# Patient Record
Sex: Female | Born: 1935 | Race: White | Hispanic: No | Marital: Married | Smoking: Never smoker
Health system: Southern US, Community
[De-identification: ages and names within clinical notes are randomized; demographics above are authoritative.]

## PROBLEM LIST (undated history)

## (undated) DIAGNOSIS — E785 Hyperlipidemia, unspecified: Secondary | ICD-10-CM

## (undated) DIAGNOSIS — G4730 Sleep apnea, unspecified: Secondary | ICD-10-CM

## (undated) DIAGNOSIS — I10 Essential (primary) hypertension: Secondary | ICD-10-CM

## (undated) DIAGNOSIS — G629 Polyneuropathy, unspecified: Secondary | ICD-10-CM

## (undated) DIAGNOSIS — E119 Type 2 diabetes mellitus without complications: Secondary | ICD-10-CM

## (undated) DIAGNOSIS — E079 Disorder of thyroid, unspecified: Secondary | ICD-10-CM

## (undated) HISTORY — PX: ABDOMINAL HYSTERECTOMY: SHX81

## (undated) HISTORY — PX: FRACTURE SURGERY: SHX138

## (undated) HISTORY — PX: TONSILLECTOMY: SUR1361

## (undated) HISTORY — PX: BREAST SURGERY: SHX581

---

## 2004-12-21 ENCOUNTER — Ambulatory Visit: Payer: Self-pay | Admitting: Unknown Physician Specialty

## 2009-09-02 ENCOUNTER — Emergency Department: Payer: Self-pay | Admitting: Emergency Medicine

## 2010-10-27 DIAGNOSIS — E039 Hypothyroidism, unspecified: Secondary | ICD-10-CM | POA: Diagnosis present

## 2010-10-27 DIAGNOSIS — G629 Polyneuropathy, unspecified: Secondary | ICD-10-CM | POA: Insufficient documentation

## 2010-10-27 DIAGNOSIS — I10 Essential (primary) hypertension: Secondary | ICD-10-CM | POA: Diagnosis present

## 2014-01-26 DIAGNOSIS — R001 Bradycardia, unspecified: Secondary | ICD-10-CM | POA: Diagnosis present

## 2014-01-27 DIAGNOSIS — Z8673 Personal history of transient ischemic attack (TIA), and cerebral infarction without residual deficits: Secondary | ICD-10-CM

## 2017-04-03 ENCOUNTER — Other Ambulatory Visit: Payer: Self-pay

## 2017-04-03 ENCOUNTER — Ambulatory Visit
Admission: EM | Admit: 2017-04-03 | Discharge: 2017-04-03 | Disposition: A | Discharge status: Home / Self Care | Payer: Medicare Other | Attending: Family Medicine | Admitting: Family Medicine

## 2017-04-03 ENCOUNTER — Encounter: Payer: Self-pay | Admitting: *Deleted

## 2017-04-03 DIAGNOSIS — L728 Other follicular cysts of the skin and subcutaneous tissue: Secondary | ICD-10-CM | POA: Diagnosis not present

## 2017-04-03 DIAGNOSIS — R220 Localized swelling, mass and lump, head: Secondary | ICD-10-CM

## 2017-04-03 DIAGNOSIS — L729 Follicular cyst of the skin and subcutaneous tissue, unspecified: Secondary | ICD-10-CM

## 2017-04-03 HISTORY — DX: Polyneuropathy, unspecified: G62.9

## 2017-04-03 HISTORY — DX: Sleep apnea, unspecified: G47.30

## 2017-04-03 HISTORY — DX: Disorder of thyroid, unspecified: E07.9

## 2017-04-03 HISTORY — DX: Essential (primary) hypertension: I10

## 2017-04-03 HISTORY — DX: Type 2 diabetes mellitus without complications: E11.9

## 2017-04-03 HISTORY — DX: Hyperlipidemia, unspecified: E78.5

## 2017-04-03 NOTE — Discharge Instructions (Signed)
Keep an eye on it.  I don't feel that you have anything to worry about.  If it is trouble some, call ENT.  Take care  Dr. Adriana Simasook

## 2017-04-03 NOTE — ED Provider Notes (Signed)
[facility]    CSN: [id number] Arrival date & time: 04/03/17  1129  History   Chief Complaint Chief Complaint  Patient presents with  . Mouth Injury   HPI  84 year old female presents with a palpable area on the right side of her mouth.  She states this is been going on for the past few months.  She saw her dentist and she was told by the hygienist that she should be evaluated.  It is not troublesome.  It is nonpainful.  She has no history of tobacco use.  It only bothers her to know that it is there.  She cannot feel it with her tongue.  Only with direct palpation.  She has no other associated symptoms.  No other complaints at this time.  Past Medical History:  Diagnosis Date  . Diabetes mellitus without complication (HCC)   . Hyperlipidemia   . Hypertension   . Neuropathy   . Sleep apnea   . Thyroid disease    Past Surgical History:  Procedure Laterality Date  . ABDOMINAL HYSTERECTOMY    . BREAST SURGERY    . FRACTURE SURGERY    . TONSILLECTOMY     OB History    No data available     Home Medications    Prior to Admission medications   Medication Sig Start Date End Date Taking? Authorizing Provider  amLODipine (NORVASC) 5 MG tablet Take 5 mg by mouth daily.   Yes [provider]  ASCORBIC ACID PO Take by mouth.   Yes [provider]  aspirin EC 81 MG tablet Take 81 mg by mouth daily.   Yes [provider]  Cinnamon 500 MG TABS Take by mouth.   Yes [provider]  gabapentin (NEURONTIN) 300 MG capsule Take 300 mg by mouth 3 (three) times daily.   Yes [provider]  GLUCOSAMINE-CHONDROITIN DS PO Take by mouth.   Yes [provider]  levothyroxine (SYNTHROID, LEVOTHROID) 75 MCG tablet Take 75 mcg by mouth daily before breakfast.   Yes [provider]  ramipril (ALTACE) 10 MG capsule Take 10 mg by mouth daily.   Yes [provider]  rosuvastatin (CRESTOR) 5 MG tablet Take 5 mg by  mouth daily at 6 PM.   Yes [provider]  traZODone (DESYREL) 50 MG tablet Take 50 mg by mouth at bedtime.   Yes [provider]  nitroGLYCERIN (NITROSTAT) 0.6 MG SL tablet Place 0.6 mg under the tongue every 5 (five) minutes as needed for chest pain.    [provider]    Family History Family History  Problem Relation Age of Onset  . Stroke Mother   . Cancer Father   . Cancer Brother     Social History Social History   Tobacco Use  . Smoking status: Never Smoker  . Smokeless tobacco: Never Used  Substance Use Topics  . Alcohol use: No    Frequency: Never  . Drug use: No     Allergies   Rosuvastatin   Review of Systems Review of Systems  Constitutional: Negative.   HENT: Negative.   Skin:       Palpable area - R cheek (perioral).   Physical Exam Triage Vital Signs ED Triage Vitals  Enc Vitals Group     BP 04/03/17 1158 (!) 153/67     Pulse Rate 04/03/17 1158 (!) 51     Resp 04/03/17 1158 16     Temp 04/03/17 1158 98.2 F (  36.8 C)     Temp Source 04/03/17 1158 Oral     SpO2 04/03/17 1158 97 %     Weight 04/03/17 1201 180 lb (81.6 kg)     Height 04/03/17 1201 5\' 3"  (1.6 m)     Head Circumference --      Peak Flow --      Pain Score 04/03/17 1158 0     Pain Loc --      Pain Edu? --      Excl. in GC? --    Updated Vital Signs BP (!) 153/67 (BP Location: Right Arm)   Pulse (!) 51   Temp 98.2 F (36.8 C) (Oral)   Resp 16   Ht 5\' 3"  (1.6 m)   Wt 180 lb (81.6 kg)   SpO2 97%   BMI 31.89 kg/m    Physical Exam  Constitutional: She is oriented to person, place, and time. She appears well-developed and well-nourished. No distress.  HENT:  Head: Normocephalic and atraumatic.  Mouth/Throat: Oropharynx is clear and moist.  Pea-sized firm nodule noted in the right perioral region.  Nontender.  No abnormalities noted in the mouth.  Cardiovascular: Normal rate and regular rhythm.  Pulmonary/Chest: Effort normal and breath sounds  normal. She has no wheezes. She has no rales.  Neurological: She is alert and oriented to person, place, and time.  Psychiatric: She has a normal mood and affect. Her behavior is normal.  Nursing note and vitals reviewed.  UC Treatments / Results  Labs (all labs ordered are listed, but only abnormal results are displayed) Labs Reviewed - No data to display  EKG  EKG Interpretation None       Radiology No results found.  Procedures Procedures (including critical care time)  Medications Ordered in UC Medications - No data to display   Initial Impression / Assessment and Plan / UC Course  I have reviewed the triage vital signs and the nursing notes.  Pertinent labs & imaging results that were available during my care of the patient were reviewed by me and considered in my medical decision making (see chart for details).     84 year old female presents with a palpable area of her right cheek.  Likely benign cyst.  She has never been a tobacco user.  No concerns for underlying malignancy.  I advised watchful waiting.  Reassurance provided.  Gave her recommendations to see ENT if worsens.  Final Clinical Impressions(s) / UC Diagnoses   Final diagnoses:  Benign skin cyst    ED Discharge Orders    None     Controlled Substance Prescriptions [place] Controlled Substance Registry consulted? Not Applicable   Tommie SamsCook, Felicita Nuncio G, DO 04/03/17 1241

## 2017-04-03 NOTE — ED Triage Notes (Signed)
Patient started having symptoms of a possible cyst located right side of mouth 3 months ago. No previous history of mouth cyst.

## 2017-10-08 DIAGNOSIS — H903 Sensorineural hearing loss, bilateral: Secondary | ICD-10-CM | POA: Insufficient documentation

## 2019-05-23 DIAGNOSIS — I5032 Chronic diastolic (congestive) heart failure: Secondary | ICD-10-CM | POA: Diagnosis present

## 2020-04-20 ENCOUNTER — Other Ambulatory Visit: Payer: Self-pay

## 2020-04-20 ENCOUNTER — Emergency Department: Payer: Medicare Other

## 2020-04-20 ENCOUNTER — Emergency Department
Admission: EM | Admit: 2020-04-20 | Discharge: 2020-04-20 | Disposition: A | Discharge status: Home / Self Care | Payer: Medicare Other | Attending: Emergency Medicine | Admitting: Emergency Medicine

## 2020-04-20 DIAGNOSIS — M79604 Pain in right leg: Secondary | ICD-10-CM | POA: Diagnosis not present

## 2020-04-20 DIAGNOSIS — Z7982 Long term (current) use of aspirin: Secondary | ICD-10-CM | POA: Diagnosis not present

## 2020-04-20 DIAGNOSIS — E1140 Type 2 diabetes mellitus with diabetic neuropathy, unspecified: Secondary | ICD-10-CM | POA: Insufficient documentation

## 2020-04-20 DIAGNOSIS — I10 Essential (primary) hypertension: Secondary | ICD-10-CM | POA: Diagnosis not present

## 2020-04-20 DIAGNOSIS — M79661 Pain in right lower leg: Secondary | ICD-10-CM | POA: Diagnosis present

## 2020-04-20 DIAGNOSIS — Z79899 Other long term (current) drug therapy: Secondary | ICD-10-CM | POA: Insufficient documentation

## 2020-04-20 NOTE — ED Provider Notes (Signed)
[facility]  ____________________________________________  Time seen: Approximately 4:20 PM  I have reviewed the triage vital signs and the nursing notes.   HISTORY  Chief Complaint Leg Pain   Historian Patient     HPI  Bonnie Henson is a 87 y.o. female with a history of diabetes, hypertension and neuropathy, presents to the emergency department with acute right calf pain.  Patient states that pain in her right calf started yesterday.  She denies falls or mechanisms of trauma.  Denies history of DVT or PE.  Denies shortness of breath or chest tightness.  No recent travel, prolonged immobilization, recent surgery or daily smoking.   Past Medical History:  Diagnosis Date  . Diabetes mellitus without complication (HCC)   . Hyperlipidemia   . Hypertension   . Neuropathy   . Sleep apnea   . Thyroid disease      Immunizations up to date:  Yes.     Past Medical History:  Diagnosis Date  . Diabetes mellitus without complication (HCC)   . Hyperlipidemia   . Hypertension   . Neuropathy   . Sleep apnea   . Thyroid disease     There are no problems to display for this patient.   Past Surgical History:  Procedure Laterality Date  . ABDOMINAL HYSTERECTOMY    . BREAST SURGERY    . FRACTURE SURGERY    . TONSILLECTOMY      Prior to Admission medications   Medication Sig Start Date End Date Taking? Authorizing Provider  amLODipine (NORVASC) 5 MG tablet Take 5 mg by mouth daily.    [provider]  ASCORBIC ACID PO Take by mouth.    [provider]  aspirin EC 81 MG tablet Take 81 mg by mouth daily.    [provider]  Cinnamon 500 MG TABS Take by mouth.    [provider]  gabapentin (NEURONTIN) 300 MG capsule Take 300 mg by mouth 3 (three) times daily.    [provider]  GLUCOSAMINE-CHONDROITIN DS PO Take by mouth.    [provider]  levothyroxine (SYNTHROID, LEVOTHROID) 75 MCG tablet Take 75  mcg by mouth daily before breakfast.    [provider]  nitroGLYCERIN (NITROSTAT) 0.6 MG SL tablet Place 0.6 mg under the tongue every 5 (five) minutes as needed for chest pain.    [provider]  ramipril (ALTACE) 10 MG capsule Take 10 mg by mouth daily.    [provider]  rosuvastatin (CRESTOR) 5 MG tablet Take 5 mg by mouth daily at 6 PM.    [provider]  traZODone (DESYREL) 50 MG tablet Take 50 mg by mouth at bedtime.    [provider]    Allergies Rosuvastatin  Family History  Problem Relation Age of Onset  . Stroke Mother   . Cancer Father   . Cancer Brother     Social History Social History   Tobacco Use  . Smoking status: Never Smoker  . Smokeless tobacco: Never Used  Vaping Use  . Vaping Use: Never used  Substance Use Topics  . Alcohol use: No  . Drug use: No     Review of Systems  Constitutional: No fever/chills Eyes:  No discharge ENT: No upper respiratory complaints. Respiratory: no cough. No SOB/ use of accessory muscles to breath Gastrointestinal:   No nausea, no vomiting.  No diarrhea.  No constipation. Musculoskeletal: Patient has right calf pain.  Skin: Negative for rash, abrasions, lacerations, ecchymosis. ____________________________________________  PHYSICAL EXAM:  VITAL SIGNS: ED Triage Vitals  Enc Vitals Group     BP 04/20/20 1331 (!) 148/73     Pulse Rate 04/20/20 1331 (!) 58     Resp 04/20/20 1331 18     Temp 04/20/20 1331 98.4 F (36.9 C)     Temp src --      SpO2 04/20/20 1331 94 %     Weight --      Height --      Head Circumference --      Peak Flow --      Pain Score 04/20/20 1330 6     Pain Loc --      Pain Edu? --      Excl. in GC? --      Constitutional: Alert and oriented. Well appearing and in no acute distress. Eyes: Conjunctivae are normal. PERRL. EOMI. Head: Atraumatic. ENT:      Nose: No congestion/rhinnorhea.      Mouth/Throat: Mucous membranes are moist.   Neck: No stridor.  No cervical spine tenderness to palpation. Hematological/Lymphatic/Immunilogical: No cervical lymphadenopathy.  Cardiovascular: Normal rate, regular rhythm. Normal S1 and S2.  Good peripheral circulation. Respiratory: Normal respiratory effort without tachypnea or retractions. Lungs CTAB. Good air entry to the bases with no decreased or absent breath sounds Gastrointestinal: Bowel sounds x 4 quadrants. Soft and nontender to palpation. No guarding or rigidity. No distention. Musculoskeletal: Full range of motion to all extremities. No obvious deformities noted. Patient has right calf pain to palpation.  Neurologic:  Normal for age. No gross focal neurologic deficits are appreciated.  Skin:  Skin is warm, dry and intact. No rash noted. Psychiatric: Mood and affect are normal for age. Speech and behavior are normal.   ____________________________________________   LABS (all labs ordered are listed, but only abnormal results are displayed)  Labs Reviewed - No data to display ____________________________________________  EKG   ____________________________________________  RADIOLOGY Geraldo Pitter, personally viewed and evaluated these images (plain radiographs) as part of my medical decision making, as well as reviewing the written report by the radiologist.  There is a poorly defined fluid collection along the right posterior knee likely ruptured Baker's cyst.  No sign of DVT.  No results found.  ____________________________________________    PROCEDURES  Procedure(s) performed:     Procedures     Medications - No data to display   ____________________________________________   INITIAL IMPRESSION / ASSESSMENT AND PLAN / ED COURSE  Pertinent labs & imaging results that were available during my care of the patient were reviewed by me and considered in my medical decision making (see chart for details).      Assessment and Plan:  Right calf  pain:  87 year old female presents to the emergency department with right calf pain that started yesterday.  Ultrasound of the right lower extremity indicates a possible ruptured popliteal cyst.  Recommended Tylenol for discomfort.  Patient wanted to know when she can return to water aerobics and patient was informed that she could return as soon as she felt comfortable.  She denies chest pain, chest tightness and shortness of breath.  Patient was accompanied by a family member who confirmed that patient had felt well at home other than right calf pain.  All patient questions were answered.    ____________________________________________  FINAL CLINICAL IMPRESSION(S) / ED DIAGNOSES  Final diagnoses:  Right leg pain      NEW MEDICATIONS STARTED DURING THIS VISIT:  ED Discharge Orders  None          This chart was dictated using voice recognition software/Dragon. Despite best efforts to proofread, errors can occur which can change the meaning. Any change was purely unintentional.     Orvil Feil, PA-C 04/20/20 1750    Dionne Bucy, MD 04/20/20 [id number]

## 2020-04-20 NOTE — ED Triage Notes (Signed)
Pt comes with c/o right leg pain. Pt states stinging pain in calf area. Pt states pain is worse when walking.

## 2020-04-20 NOTE — Discharge Instructions (Addendum)
Take Tylenol as needed for discomfort.  Return to water aerobics when comfortable.

## 2020-04-20 NOTE — ED Triage Notes (Signed)
Pt comes with c/o right leg pain. Pt states pain in right calf area. Pt states this started yesterday. Pt states some pain and stinging

## 2021-03-14 ENCOUNTER — Ambulatory Visit: Payer: Medicare Other

## 2021-03-21 ENCOUNTER — Ambulatory Visit: Payer: Medicare Other | Attending: Unknown Physician Specialty

## 2021-03-21 ENCOUNTER — Other Ambulatory Visit: Payer: Self-pay

## 2021-03-21 DIAGNOSIS — M6281 Muscle weakness (generalized): Secondary | ICD-10-CM | POA: Diagnosis present

## 2021-03-21 DIAGNOSIS — M542 Cervicalgia: Secondary | ICD-10-CM | POA: Diagnosis present

## 2021-03-21 NOTE — Therapy (Addendum)
[facility] [address]. [place], [place], [address] Phone: [phone]   Fax:  [phone]  Physical Therapy Evaluation  Patient Details  Name: MARSHANA BAKEN MRN: [id number] Date of Birth: 10/02/1929 Referring Provider (PT): Dr. Algie Coffer   Encounter Date: 03/21/2021   PT End of Session - 03/21/21 1605     Visit Number 1    Number of Visits 13    Date for PT Re-Evaluation 05/02/21    PT Start Time 1400    PT Stop Time 1440    PT Time Calculation (min) 40 min    Activity Tolerance Patient tolerated treatment well;Patient limited by pain    Behavior During Therapy [facility] for tasks assessed/performed             Past Medical History:  Diagnosis Date   Diabetes mellitus without complication ([place])    Hyperlipidemia    Hypertension    Neuropathy    Sleep apnea    Thyroid disease     Past Surgical History:  Procedure Laterality Date   ABDOMINAL HYSTERECTOMY     BREAST SURGERY     FRACTURE SURGERY     TONSILLECTOMY      There were no vitals filed for this visit.   SUBJECTIVE  Chief complaint:  L sided neck pain History: Patient has history of neck pain insidious onset in March 2021. No known MOI the pt stated that "the pain just started one day". Pt also has history of arthritis within her neck. When the pain first began the patient went to her GP who referred her to PT. PT performed STM and periscapular strengthening, but pain did not improve. [facility] which also provided no relief. Pt then went to a neurologist who gave her another shot which helped to alleviate some of the pain. Pt received a second set of shots from the neurologist within the last few weeks. The second set of shots provided some relief. Pt also uses heat, biofreeze and OTC pain medications to help with the pain. Pain does not affect sleep. Pt reports that she does not notice the pain in the mornings, but it  becomes more apparent as the day goes on and she moves her neck more frequently. Pt reports also occasionally having headaches, but they presented before the neck pain and the neck pain does not cause an increase in headaches. Pain from neck will occasionally travel up into L temple and eye, described as a "pressure" behind the eye. 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  Referring Dx: Occipital Neuralgia MD: Dr. Algie Coffer Pain: 8/10 Present, 0/10 Best, 10/10 Worst: Aggravating factors: Rotating head from side to side, looking up and down and general overuse  Easing factors: Heat, biofreeze and OTC pain medication 24 hour pain behavior: Pain does not affect sleep. Pt reports that pain is not as noticeable in the mornings, but increases as the day goes on and she moves her neck more frequently Recent neck trauma: No Prior history of neck injury or pain: Yes Pain quality: pain quality: throbbing Radiating pain: No  Numbness/Tingling: No Dominant hand: right Imaging: Yes, MRI cervical spine without contrast: IMPRESSION 1. At C5-C6 and C6-C7, there is moderate to severe spinal canal stenosis  primarily related to degenerative disc disease and uncovertebral  hypertrophy.  2. Moderate or severe neuroforaminal narrowing as follows: Bilateral C2-C3, bilateral C3-C4, bilateral C4-C5, bilateral  C5-C6, bilateral C6-C7, and left C7-T1.   OBJECTIVE  Mental Status Difficult to assess due to hearing deficits   SENSATION: Grossly intact to light touch bilateral UE as determined by testing dermatomes C2-T2   MUSCULOSKELETAL: Tremor: None Bulk: Normal Tone: Normal    Palpation R/L Upper trap: No tenderness bilaterally Levator: No tenderness bilaterally Paraspinals: R: No tenderness L: Slight tenderness Suboccipitals: R: No tenderness L: Pain   Strength Neck Flexion: Grossly WNL* Neck Extension: Grossly WNL* Neck L Lateral Flexion: Grossly WNL* Neck R Lateral  Flexion: Grossly WNL Neck L Rotation: Grossly WNL* Neck R Rotation: Grossly WNL Shoulder Elevation: Grossly WNL Shoulder Flexion: R and L grossly WNL Shoulder ABD: R and L grossly WNL Elbow Flexion: R and L grossly WNL Elbow Extension: R and L grossly WNL Wrist Flexion: R and L grossly WNL Wrist Extension: R and L grossly WNL Hand ABD: R and L grossly WNL Hand ADD: R and L grossly WNL Grip: Symmetrical and strong *Indicates pain  AROM R/L 45* Cervical Flexion 32* Cervical Extension 12 / 15* Cervical Lateral Flexion 25* /35* Cervical Rotation       Passive Accessory Intervertebral Motion (PAIVM) Pt reported neck pain with supine CPA C2-C5 due to hand positioning on the vertebrae. Generally hypomobile throughout, pt unable to lay prone    SPECIAL TESTS  Distraction Test: Negative, caused pain due to hand placement                Objective measurements completed on examination: See above findings.                  PT Short Term Goals - 03/21/21 1609       PT SHORT TERM GOAL #1   Title Pt will be independent with HEP in order to improve strength and decrease back pain in order to improve pain-free function at home and work.    Time 3    Period Weeks    Status New    Target Date 04/11/21               PT Long Term Goals - 03/21/21 1609       PT LONG TERM GOAL #1   Title Pt will be able to sit for the entirety of a meal without neck pain.    Time 6    Period Weeks    Status New    Target Date 05/02/21      PT LONG TERM GOAL #2   Title Pt's FOTO score will increase to at least a 56 to demonstrate significant improvement in function related to her neck pain    Baseline 03/21/21: 47    Time 6    Period Weeks    Status New    Target Date 05/02/21      PT LONG TERM GOAL #3   Title Pt will increase R neck rotation by at least 10 degree to be symmetrical with the L side in order to be able to turn head while sitting next to others  at the dining table.    Baseline L rotation: 35, R rotation: 25    Time 6    Period Weeks    Status New    Target Date 05/02/21      PT LONG TERM GOAL #4   Title Pt will report at least 50% improvement in her neck symptoms in order to perform all of her household and community activities with less pain    Time  6    Period Weeks    Status New    Target Date 05/02/21                    Plan - 03/21/21 1606     Clinical Impression Statement Pt is a pleasant 88 y/o female referred for neck pain. PT examination revealed pain upon palpation of the suboccipitals and upper paraspinal muscles. Examination also revealed deficits in ROM as well as pain with muscle contraction. Pt will benefit from skilled PT services to address pain and begin to work on strengthening the posterior neck muscles.    Personal Factors and Comorbidities Age;Time since onset of injury/illness/exacerbation;Past/Current Experience;Comorbidity 3+    Comorbidities Diabetes, neuropathy, OA    Examination-Activity Limitations Dressing;Hygiene/Grooming;Self Feeding    Examination-Participation Restrictions Shop;Community Activity    Stability/Clinical Decision Making Stable/Uncomplicated    Clinical Decision Making Low    Rehab Potential Fair    PT Frequency 2x / week    PT Duration 6 weeks    PT Treatment/Interventions Cryotherapy;Electrical Stimulation;Iontophoresis 4mg /ml Dexamethasone;Moist Heat;Traction;Ultrasound;Therapeutic activities;Therapeutic exercise;Neuromuscular re-education;Manual techniques;Passive range of motion;Dry needling;Spinal Manipulations;Joint Manipulations    PT Next Visit Plan Review HEP, light stretching, manual techniques, consider NDI    PT Home Exercise Plan Access Code: [email or web address]    Consulted and Agree with Plan of Care Patient             Patient will benefit from skilled therapeutic intervention in order to improve the following deficits and impairments:  Decreased strength,  Hypomobility, Pain, Decreased range of motion  Visit Diagnosis: Cervicalgia  Muscle weakness (generalized)     Problem List There are no problems to display for this patient.  Leul Narramore SPT Phillips Grout PT, DPT, GCS  Huprich,Jason, PT 03/22/2021, 11:43 AM  [facility] [address] [place], [place], [address] Phone: [phone]   Fax:  [phone]  Name: SARIBEL DEWAR MRN: [id number] Date of Birth: 1929-09-02

## 2021-03-21 NOTE — Patient Instructions (Signed)
Access Code: [email or web address] URL: [email or web address] Date: 03/21/2021 Prepared by: Ria Comment  Exercises Seated Levator Scapulae Stretch - 2 x daily - 7 x weekly - 3 reps - 45s hold Supine Cervical Retraction with Towel - 2 x daily - 7 x weekly - 2 sets - 10 reps - 5s hold

## 2021-03-23 ENCOUNTER — Ambulatory Visit: Payer: Medicare Other

## 2021-03-28 ENCOUNTER — Ambulatory Visit: Payer: Medicare Other

## 2021-03-28 ENCOUNTER — Other Ambulatory Visit: Payer: Self-pay

## 2021-03-28 DIAGNOSIS — M542 Cervicalgia: Secondary | ICD-10-CM

## 2021-03-28 DIAGNOSIS — M6281 Muscle weakness (generalized): Secondary | ICD-10-CM

## 2021-03-28 NOTE — Therapy (Addendum)
[facility] [address] [place], [place], [address] Phone: [phone]   Fax:  [phone]  Physical Therapy Treatment  Patient Details  Name: Bonnie Henson MRN: [id number] Date of Birth: 04-Sep-1929 Referring Provider (PT): Dr. Theadore Nan   Encounter Date: 03/28/2021   PT End of Session - 03/28/21 1413     Visit Number 2    Number of Visits 13    Date for PT Re-Evaluation 05/02/21    Authorization Type eval 03/21/21    PT Start Time 1405    PT Stop Time 1445    PT Time Calculation (min) 40 min    Activity Tolerance Patient tolerated treatment well;Patient limited by pain    Behavior During Therapy [facility] for tasks assessed/performed             Past Medical History:  Diagnosis Date   Diabetes mellitus without complication (HCC)    Hyperlipidemia    Hypertension    Neuropathy    Sleep apnea    Thyroid disease     Past Surgical History:  Procedure Laterality Date   ABDOMINAL HYSTERECTOMY     BREAST SURGERY     FRACTURE SURGERY     TONSILLECTOMY      There were no vitals filed for this visit.   Subjective Assessment - 03/28/21 1410     Subjective Pt reports only one bout of neck pain today while eating at lunch. Once she moved to her chair the pain subsided within a few seconds. Pt reports she is doing her HEP daily, but the exercises are causing slight discomfort.    Patient is accompained by: --   Friend   Pertinent History Patient has history of neck pain insidious onset in March 2021. No known MOI the pt stated that "the pain just started one day". Pt also has history of arthritis within her neck. When the pain first began the patient went to her GP who referred her to PT. PT performed STM and periscapular strengthening, but pain did not improve. [facility] which also provided no relief. Pt then went to a neurologist who gave her another shot which helped to alleviate  some of the pain. Pt received a second set of shots from the neurologist within the last few weeks. The second set of shots provided some relief. Pt also uses heat, biofreeze and OTC pain medications to help with the pain. Pain does not affect sleep. Pt reports that she does not notice the pain in the mornings, but it becomes more apparent as the day goes on and she moves her neck more frequently. Pt reports also occasionally having headaches, but they presented before the neck pain and the neck pain does not cause an increase in headaches. Pain from neck will occasionally travel up into L temple and eye, described as a "pressure" behind the eye. 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    Limitations Sitting;Other (comment)   Moving head to look around   Pain Onset --              TREATMENT   Manual Therapy: Moist heat on upper traps and cervical paraspinals in supine for while getting interval history Supine cervical P/A mobilizations C3-C6 grade I-II 20s/bout x 2 bouts per level Supine cervical lateral glide mobilizations C3-C6 grade I-II 20s/bout x 2 bouts per level bilaterally STM to upper traps  and cervical paraspinals   There-Ex: Seated upper trap stretch 2 x 30s hold bilaterally Seated levator stretch 2 x 30s hold bilaterally, only completed 1 to the L due to pain Seated neck flexion stretch 2 x 30s; Seated repeated retraction x 10;   Pt educated throughout session about proper posture and technique with exercises. Improved exercise technique, movement at target joints, use of target muscles after min to mod verbal, visual, tactile cues.    Focused session on pain control and light stretching. Pt unable to lay prone so performed all mobilizations in supine. Due to positioning unable to access C2 or C7 for mobilization. Mild discomfort reported with the P/A mobilizations, but pain dissipated as the bout continued. Pt stated pain with  stretching. Stopped stretching to educate pt on how to properly stretch and hold before the point of pain. Pt would benefit from continued PT services to address deficits in strength, ROM and pain.                    PT Short Term Goals - 03/21/21 1609       PT SHORT TERM GOAL #1   Title Pt will be independent with HEP in order to improve strength and decrease back pain in order to improve pain-free function at home and work.    Time 3    Period Weeks    Status New    Target Date 04/11/21               PT Long Term Goals - 03/21/21 1609       PT LONG TERM GOAL #1   Title Pt will be able to sit for the entirety of a meal without neck pain.    Time 6    Period Weeks    Status New    Target Date 05/02/21      PT LONG TERM GOAL #2   Title Pt's FOTO score will increase to at least a 56 to demonstrate significant improvement in function related to her neck pain    Baseline 03/21/21: 47    Time 6    Period Weeks    Status New    Target Date 05/02/21      PT LONG TERM GOAL #3   Title Pt will increase R neck rotation by at least 10 degree to be symmetrical with the L side in order to be able to turn head while sitting next to others at the dining table.    Baseline L rotation: 35, R rotation: 25    Time 6    Period Weeks    Status New    Target Date 05/02/21      PT LONG TERM GOAL #4   Title Pt will report at least 50% improvement in her neck symptoms in order to perform all of her household and community activities with less pain    Time 6    Period Weeks    Status New    Target Date 05/02/21                   Plan - 03/28/21 1413     Clinical Impression Statement Focused session on pain control and light stretching. Pt unable to lay prone so performed all mobilizations in supine. Due to positioning unable to access C2 or C7 for mobilization. Mild discomfort reported with the P/A mobilizations, but pain dissipated as the bout continued. Pt  stated pain with stretching. Stopped stretching to educate  pt on how to properly stretch and hold before the point of pain. Pt would benefit from continued PT services to address deficits in strength, ROM and pain.    Personal Factors and Comorbidities Age;Time since onset of injury/illness/exacerbation;Past/Current Experience;Comorbidity 3+    Comorbidities Diabetes, neuropathy, OA    Examination-Activity Limitations Dressing;Hygiene/Grooming;Self Feeding    Examination-Participation Restrictions Shop;Community Activity    Stability/Clinical Decision Making Stable/Uncomplicated    Rehab Potential Fair    PT Frequency 2x / week    PT Duration 6 weeks    PT Treatment/Interventions Cryotherapy;Electrical Stimulation;Iontophoresis 4mg /ml Dexamethasone;Moist Heat;Traction;Ultrasound;Therapeutic activities;Therapeutic exercise;Neuromuscular re-education;Manual techniques;Passive range of motion;Dry needling;Spinal Manipulations;Joint Manipulations    PT Next Visit Plan Review HEP, light stretching, manual techniques, consider NDI    PT Home Exercise Plan Access Code: [email or web address]    Consulted and Agree with Plan of Care Patient             Patient will benefit from skilled therapeutic intervention in order to improve the following deficits and impairments:  Decreased strength, Hypomobility, Pain, Decreased range of motion  Visit Diagnosis: Cervicalgia  Muscle weakness (generalized)     Problem List There are no problems to display for this patient.    Bayla Mcgovern SPT PT, DPT, GCS  Huprich,Jason, PT 03/29/2021, 11:27 AM  [facility] [address] [place], [place], [place] Phone: [phone]   Fax:  [phone]  Name: Bonnie Henson MRN: Dyanne Iha Date of Birth: 02-01-1930

## 2021-03-30 ENCOUNTER — Other Ambulatory Visit: Payer: Self-pay

## 2021-03-30 ENCOUNTER — Ambulatory Visit: Payer: Medicare Other | Attending: Unknown Physician Specialty

## 2021-03-30 DIAGNOSIS — M542 Cervicalgia: Secondary | ICD-10-CM | POA: Insufficient documentation

## 2021-03-30 DIAGNOSIS — M6281 Muscle weakness (generalized): Secondary | ICD-10-CM | POA: Diagnosis present

## 2021-03-31 NOTE — Therapy (Signed)
[place] [facility] [address] [place], [place], [address] Phone: [phone]   Fax:  [phone]  Physical Therapy Treatment  Patient Details  Name: Bonnie Henson MRN: [id number] Date of Birth: 1929/03/07 Referring Provider (PT): Dr. Theadore Nan   Encounter Date: 03/30/2021   PT End of Session - 03/31/21 0948     Visit Number 3    Number of Visits 13    Date for PT Re-Evaluation 05/02/21    Authorization Type eval 03/21/21    PT Start Time 1355    PT Stop Time 1440    PT Time Calculation (min) 45 min    Activity Tolerance Patient tolerated treatment well;Patient limited by pain    Behavior During Therapy [facility] for tasks assessed/performed             Past Medical History:  Diagnosis Date   Diabetes mellitus without complication (HCC)    Hyperlipidemia    Hypertension    Neuropathy    Sleep apnea    Thyroid disease     Past Surgical History:  Procedure Laterality Date   ABDOMINAL HYSTERECTOMY     BREAST SURGERY     FRACTURE SURGERY     TONSILLECTOMY      There were no vitals filed for this visit.   Subjective Assessment - 03/30/21 1404     Subjective Pt reports that she is doing alright today. No resting pain upon arrival but she continues to experience pain with AROM. No specific questions or concerns currently.    Patient is accompained by: --   Friend   Pertinent History Patient has history of neck pain insidious onset in March 2021. No known MOI the pt stated that "the pain just started one day". Pt also has history of arthritis within her neck. When the pain first began the patient went to her GP who referred her to PT. PT performed STM and periscapular strengthening, but pain did not improve. [facility] which also provided no relief. Pt then went to a neurologist who gave her another shot which helped to alleviate some of the pain. Pt received a second set of shots from the  neurologist within the last few weeks. The second set of shots provided some relief. Pt also uses heat, biofreeze and OTC pain medications to help with the pain. Pain does not affect sleep. Pt reports that she does not notice the pain in the mornings, but it becomes more apparent as the day goes on and she moves her neck more frequently. Pt reports also occasionally having headaches, but they presented before the neck pain and the neck pain does not cause an increase in headaches. Pain from neck will occasionally travel up into L temple and eye, described as a "pressure" behind the eye. 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    Limitations Sitting;Other (comment)   Moving head to look around   Currently in Pain? No/denies              TREATMENT   Manual Therapy: Moist heat on upper traps and cervical paraspinals in supine for while getting interval history Supine cervical P/A mobilizations C2-C5 grade I-II 20s/bout x 1 bout per level Supine cervical lateral glide mobilizations C4-C6 grade I-II 20s/bout x 1 bouts per level bilaterally during PROM lateral flexion; Seated upper trap stretch 2 x 30s hold bilaterally Seated lateral neck flexion  stretch 2 x 30s bilaterally; Seated levator stretch 2 x 30s hold bilaterally, only completed 1 to the L due to pain Suboccipital release x 2 minutes; Gentle cervical traction 10s hold/10s relax x 3; STM to upper traps and cervical paraspinals   There-Ex: Seated repeated cervical retraction 3s hold with overpressure by therapist 2 x 10; Seated scapular retraction 2 x 10; Seated "w's" with red tband 2 x 10; Seated rows with red tband 2 x 10; Posture education with patient;   Pt educated throughout session about proper posture and technique with exercises. Improved exercise technique, movement at target joints, use of target muscles after min to mod verbal, visual, tactile cues.    Focused session on pain  control, light stretching, and added some additional strengthening. Pt unable to lay prone so performed all mobilizations in supine. Added seated ER strengthening as well as rows. Postural education provided to patient and friend. Pt would benefit from continued PT services to address deficits in strength, ROM and pain.                    PT Short Term Goals - 03/21/21 1609       PT SHORT TERM GOAL #1   Title Pt will be independent with HEP in order to improve strength and decrease back pain in order to improve pain-free function at home and work.    Time 3    Period Weeks    Status New    Target Date 04/11/21               PT Long Term Goals - 03/21/21 1609       PT LONG TERM GOAL #1   Title Pt will be able to sit for the entirety of a meal without neck pain.    Time 6    Period Weeks    Status New    Target Date 05/02/21      PT LONG TERM GOAL #2   Title Pt's FOTO score will increase to at least a 56 to demonstrate significant improvement in function related to her neck pain    Baseline 03/21/21: 47    Time 6    Period Weeks    Status New    Target Date 05/02/21      PT LONG TERM GOAL #3   Title Pt will increase R neck rotation by at least 10 degree to be symmetrical with the L side in order to be able to turn head while sitting next to others at the dining table.    Baseline L rotation: 35, R rotation: 25    Time 6    Period Weeks    Status New    Target Date 05/02/21      PT LONG TERM GOAL #4   Title Pt will report at least 50% improvement in her neck symptoms in order to perform all of her household and community activities with less pain    Time 6    Period Weeks    Status New    Target Date 05/02/21                   Plan - 03/31/21 0948     Clinical Impression Statement ?Focused session on pain control, light stretching, and added some additional strengthening. Pt unable to lay prone so performed all mobilizations in supine.  Added seated ER strengthening as well as rows. Postural education provided to patient and friend. Pt would benefit  from continued PT services to address deficits in strength, ROM and pain.    Personal Factors and Comorbidities Age;Time since onset of injury/illness/exacerbation;Past/Current Experience;Comorbidity 3+    Comorbidities Diabetes, neuropathy, OA    Examination-Activity Limitations Dressing;Hygiene/Grooming;Self Feeding    Examination-Participation Restrictions Shop;Community Activity    Stability/Clinical Decision Making Stable/Uncomplicated    Rehab Potential Fair    PT Frequency 2x / week    PT Duration 6 weeks    PT Treatment/Interventions Cryotherapy;Electrical Stimulation;Iontophoresis 4mg /ml Dexamethasone;Moist Heat;Traction;Ultrasound;Therapeutic activities;Therapeutic exercise;Neuromuscular re-education;Manual techniques;Passive range of motion;Dry needling;Spinal Manipulations;Joint Manipulations    PT Next Visit Plan Review HEP, light stretching, manual techniques, consider NDI    PT Home Exercise Plan Access Code: [email or web address]    Consulted and Agree with Plan of Care Patient             Patient will benefit from skilled therapeutic intervention in order to improve the following deficits and impairments:  Decreased strength, Hypomobility, Pain, Decreased range of motion  Visit Diagnosis: Cervicalgia  Muscle weakness (generalized)     Problem List There are no problems to display for this patient.    PT, DPT, GCS  Wilfred Dayrit, PT 03/31/2021, 9:57 AM  [facility] [address] [place], [place], [place] Phone: [phone]   Fax:  [phone]  Name: CEYDA PETERKA MRN: Dyanne Iha Date of Birth: [birth year]

## 2021-04-04 ENCOUNTER — Other Ambulatory Visit: Payer: Self-pay

## 2021-04-04 ENCOUNTER — Ambulatory Visit: Payer: Medicare Other

## 2021-04-04 DIAGNOSIS — M542 Cervicalgia: Secondary | ICD-10-CM

## 2021-04-04 DIAGNOSIS — M6281 Muscle weakness (generalized): Secondary | ICD-10-CM

## 2021-04-04 NOTE — Therapy (Signed)
[facility] [address] [place], [place], [address] Phone: [phone]   Fax:  [phone]  Physical Therapy Treatment  Patient Details  Name: Bonnie Henson MRN: [id number] Date of Birth: 10-Jan-1930 Referring Provider (PT): Dr. Algie Coffer   Encounter Date: 04/04/2021   PT End of Session - 04/05/21 1059     Visit Number 4    Number of Visits 13    Date for PT Re-Evaluation 05/02/21    Authorization Type eval 03/21/21    PT Start Time 1415    PT Stop Time 1445    PT Time Calculation (min) 30 min    Activity Tolerance Patient tolerated treatment well;Patient limited by pain    Behavior During Therapy [facility] for tasks assessed/performed              Past Medical History:  Diagnosis Date   Diabetes mellitus without complication ([place])    Hyperlipidemia    Hypertension    Neuropathy    Sleep apnea    Thyroid disease     Past Surgical History:  Procedure Laterality Date   ABDOMINAL HYSTERECTOMY     BREAST SURGERY     FRACTURE SURGERY     TONSILLECTOMY      There were no vitals filed for this visit.   Subjective Assessment - 04/05/21 1058     Subjective Pt reports that she is doing alright today. No resting pain upon arrival but she continues to experience pain with AROM. However she feels like her neck pain is improving. No specific questions or concerns currently.    Patient is accompained by: --   Friend   Pertinent History Patient has history of neck pain insidious onset in March 2021. No known MOI the pt stated that "the pain just started one day". Pt also has history of arthritis within her neck. When the pain first began the patient went to her GP who referred her to PT. PT performed STM and periscapular strengthening, but pain did not improve. [facility] which also provided no relief. Pt then went to a neurologist who gave her another shot which helped to alleviate some of  the pain. Pt received a second set of shots from the neurologist within the last few weeks. The second set of shots provided some relief. Pt also uses heat, biofreeze and OTC pain medications to help with the pain. Pain does not affect sleep. Pt reports that she does not notice the pain in the mornings, but it becomes more apparent as the day goes on and she moves her neck more frequently. Pt reports also occasionally having headaches, but they presented before the neck pain and the neck pain does not cause an increase in headaches. Pain from neck will occasionally travel up into L temple and eye, described as a "pressure" behind the eye. 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    Limitations Sitting;Other (comment)   Moving head to look around   Currently in Pain? No/denies                TREATMENT   Manual Therapy: Moist heat on upper traps and cervical paraspinals in supine for 19min while getting interval history Supine cervical P/A mobilizations C2-C5 grade I-II 20s/bout x 1 bout per level Supine cervical lateral glide mobilizations C4-C6 grade I-II 20s/bout x 1 bouts per level bilaterally during PROM lateral flexion; Seated  upper trap stretch x 30s hold bilaterally Seated lateral neck flexion stretch x 30s bilaterally; Suboccipital release x 2 minutes; Gentle cervical traction 10s hold/10s relax x 3; STM to L upper traps and cervical paraspinals   There-Ex: Supine repeated cervical retraction 3s hold with overpressure by therapist x 10; Supine manually resisted cervical rotation x 10 bilaterally; Supine manually resisted cervical lateral flexion x 10 bilaterally; Seated scapular retraction x 10; Seated "w's" with red tband x 10;   Pt educated throughout session about proper posture and technique with exercises. Improved exercise technique, movement at target joints, use of target muscles after min to mod verbal, visual, tactile cues.    Pt  arrived late so session abbreviated accordingly. Focused session on pain control, light stretching, and added some additional strengthening. Pt unable to lay prone so performed all mobilizations in supine. Continued with periscapular exercises as well as resisted cervical strengthening. Postural education provided to patient and friend. Pt would benefit from continued PT services to address deficits in strength, ROM and pain.                    PT Short Term Goals - 03/21/21 1609       PT SHORT TERM GOAL #1   Title Pt will be independent with HEP in order to improve strength and decrease back pain in order to improve pain-free function at home and work.    Time 3    Period Weeks    Status New    Target Date 04/11/21               PT Long Term Goals - 03/21/21 1609       PT LONG TERM GOAL #1   Title Pt will be able to sit for the entirety of a meal without neck pain.    Time 6    Period Weeks    Status New    Target Date 05/02/21      PT LONG TERM GOAL #2   Title Pt's FOTO score will increase to at least a 56 to demonstrate significant improvement in function related to her neck pain    Baseline 03/21/21: 47    Time 6    Period Weeks    Status New    Target Date 05/02/21      PT LONG TERM GOAL #3   Title Pt will increase R neck rotation by at least 10 degree to be symmetrical with the L side in order to be able to turn head while sitting next to others at the dining table.    Baseline L rotation: 35, R rotation: 25    Time 6    Period Weeks    Status New    Target Date 05/02/21      PT LONG TERM GOAL #4   Title Pt will report at least 50% improvement in her neck symptoms in order to perform all of her household and community activities with less pain    Time 6    Period Weeks    Status New    Target Date 05/02/21                   Plan - 04/05/21 1100     Clinical Impression Statement Pt arrived late so session abbreviated accordingly.  Focused session on pain control, light stretching, and added some additional strengthening. Pt unable to lay prone so performed all mobilizations in supine. Continued with periscapular exercises as well  as resisted cervical strengthening. Postural education provided to patient and friend. Pt would benefit from continued PT services to address deficits in strength, ROM and pain.    Personal Factors and Comorbidities Age;Time since onset of injury/illness/exacerbation;Past/Current Experience;Comorbidity 3+    Comorbidities Diabetes, neuropathy, OA    Examination-Activity Limitations Dressing;Hygiene/Grooming;Self Feeding    Examination-Participation Restrictions Shop;Community Activity    Stability/Clinical Decision Making Stable/Uncomplicated    Rehab Potential Fair    PT Frequency 2x / week    PT Duration 6 weeks    PT Treatment/Interventions Cryotherapy;Electrical Stimulation;Iontophoresis 4mg /ml Dexamethasone;Moist Heat;Traction;Ultrasound;Therapeutic activities;Therapeutic exercise;Neuromuscular re-education;Manual techniques;Passive range of motion;Dry needling;Spinal Manipulations;Joint Manipulations    PT Next Visit Plan Review HEP, light stretching, manual techniques, consider NDI    PT Home Exercise Plan Access Code: [email or web address]    Consulted and Agree with Plan of Care Patient              Patient will benefit from skilled therapeutic intervention in order to improve the following deficits and impairments:  Decreased strength, Hypomobility, Pain, Decreased range of motion  Visit Diagnosis: Cervicalgia  Muscle weakness (generalized)     Problem List There are no problems to display for this patient.    Phillips Grout PT, DPT, GCS  Greyden Besecker, PT 04/05/2021, 11:05 AM  [place] [facility] [address] [place], [place], [address] Phone: [phone]   Fax:  [phone]  Name: Bonnie Henson MRN: [id number] Date of Birth:  02-16-1930

## 2021-04-06 ENCOUNTER — Other Ambulatory Visit: Payer: Self-pay

## 2021-04-06 ENCOUNTER — Ambulatory Visit: Payer: Medicare Other

## 2021-04-06 DIAGNOSIS — M542 Cervicalgia: Secondary | ICD-10-CM | POA: Diagnosis not present

## 2021-04-06 DIAGNOSIS — M6281 Muscle weakness (generalized): Secondary | ICD-10-CM

## 2021-04-06 NOTE — Patient Instructions (Signed)

## 2021-04-07 NOTE — Therapy (Signed)
[facility] [address] [place], [place], [address] Phone: [phone]   Fax:  [phone]  Physical Therapy Treatment  Patient Details  Name: Bonnie Henson MRN: [id number] Date of Birth: 03-09-1929 Referring Provider (PT): Dr. Theadore Nan   Encounter Date: 04/06/2021   PT End of Session - 04/07/21 1047     Visit Number 5    Number of Visits 13    Date for PT Re-Evaluation 05/02/21    Authorization Type eval 03/21/21    PT Start Time 1410    PT Stop Time 1445    PT Time Calculation (min) 35 min    Activity Tolerance Patient tolerated treatment well;Patient limited by pain    Behavior During Therapy [facility] for tasks assessed/performed              Past Medical History:  Diagnosis Date   Diabetes mellitus without complication (HCC)    Hyperlipidemia    Hypertension    Neuropathy    Sleep apnea    Thyroid disease     Past Surgical History:  Procedure Laterality Date   ABDOMINAL HYSTERECTOMY     BREAST SURGERY     FRACTURE SURGERY     TONSILLECTOMY      There were no vitals filed for this visit.   Subjective Assessment - 04/07/21 1027     Subjective Pt reports that she is doing alright today but her neck has been more flared up recently. She complains of "1.5-2/10" resting pain upon arrival and worse when she turns her head. No specific questions currently.    Patient is accompained by: --   Friend   Pertinent History Patient has history of neck pain insidious onset in March 2021. No known MOI the pt stated that "the pain just started one day". Pt also has history of arthritis within her neck. When the pain first began the patient went to her GP who referred her to PT. PT performed STM and periscapular strengthening, but pain did not improve. [facility] which also provided no relief. Pt then went to a neurologist who gave her another shot which helped to alleviate some of the  pain. Pt received a second set of shots from the neurologist within the last few weeks. The second set of shots provided some relief. Pt also uses heat, biofreeze and OTC pain medications to help with the pain. Pain does not affect sleep. Pt reports that she does not notice the pain in the mornings, but it becomes more apparent as the day goes on and she moves her neck more frequently. Pt reports also occasionally having headaches, but they presented before the neck pain and the neck pain does not cause an increase in headaches. Pain from neck will occasionally travel up into L temple and eye, described as a "pressure" behind the eye. 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    Limitations Sitting;Other (comment)   Moving head to look around               TREATMENT   Manual Therapy: Supine cervical P/A mobilizations C2-C5 grade I-II 20s/bout x 1 bout per level Supine cervical lateral glide mobilizations C4-C6 grade I-II 20s/bout x 1 bouts per level bilaterally during PROM lateral flexion; Supine upper trap stretch x 30s hold bilaterally Supine lateral neck flexion stretch x 30s bilaterally; Supine levator scapulae stretch x 30s bilaterally; Suboccipital  release x 2 minutes; Gentle cervical traction 10s hold/10s relax x 3; STM to L upper traps and cervical paraspinals; Discussed trigger point dry needling and education provided;   There-Ex: Supine repeated cervical retraction 3s hold with overpressure by therapist x 10; Supine manually resisted cervical rotation x 10 bilaterally; Supine manually resisted cervical lateral flexion x 10 bilaterally;   Pt educated throughout session about proper posture and technique with exercises. Improved exercise technique, movement at target joints, use of target muscles after min to mod verbal, visual, tactile cues.    Focused session on pain control, light stretching, and strengthening. Pt unable to lay prone so  performed all mobilizations in supine. Continued with resisted cervical strengthening. Education provided about trigger point dry needling and pt will consider before next session. Therapist might also consider utilizing combination estim with ultrasound for trigger point management. Pt would benefit from continued PT services to address deficits in strength, ROM and pain.                  PT Education - 04/07/21 1035     Education Details Trigger point dry needling    Person(s) Educated Patient    Methods Explanation    Comprehension Verbalized understanding              PT Short Term Goals - 03/21/21 1609       PT SHORT TERM GOAL #1   Title Pt will be independent with HEP in order to improve strength and decrease back pain in order to improve pain-free function at home and work.    Time 3    Period Weeks    Status New    Target Date 04/11/21               PT Long Term Goals - 03/21/21 1609       PT LONG TERM GOAL #1   Title Pt will be able to sit for the entirety of a meal without neck pain.    Time 6    Period Weeks    Status New    Target Date 05/02/21      PT LONG TERM GOAL #2   Title Pt's FOTO score will increase to at least a 56 to demonstrate significant improvement in function related to her neck pain    Baseline 03/21/21: 47    Time 6    Period Weeks    Status New    Target Date 05/02/21      PT LONG TERM GOAL #3   Title Pt will increase R neck rotation by at least 10 degree to be symmetrical with the L side in order to be able to turn head while sitting next to others at the dining table.    Baseline L rotation: 35, R rotation: 25    Time 6    Period Weeks    Status New    Target Date 05/02/21      PT LONG TERM GOAL #4   Title Pt will report at least 50% improvement in her neck symptoms in order to perform all of her household and community activities with less pain    Time 6    Period Weeks    Status New    Target Date 05/02/21                    Plan - 04/07/21 1051     Clinical Impression Statement Focused session on pain control, light stretching, and  strengthening. Pt unable to lay prone so performed all mobilizations in supine. Continued with resisted cervical strengthening. Education provided about trigger point dry needling and pt will consider before next session. Therapist might also consider utilizing combination estim with ultrasound for trigger point management. Pt would benefit from continued PT services to address deficits in strength, ROM and pain.    Personal Factors and Comorbidities Age;Time since onset of injury/illness/exacerbation;Past/Current Experience;Comorbidity 3+    Comorbidities Diabetes, neuropathy, OA    Examination-Activity Limitations Dressing;Hygiene/Grooming;Self Feeding    Examination-Participation Restrictions Shop;Community Activity    Stability/Clinical Decision Making Stable/Uncomplicated    Rehab Potential Fair    PT Frequency 2x / week    PT Duration 6 weeks    PT Treatment/Interventions Cryotherapy;Electrical Stimulation;Iontophoresis 4mg /ml Dexamethasone;Moist Heat;Traction;Ultrasound;Therapeutic activities;Therapeutic exercise;Neuromuscular re-education;Manual techniques;Passive range of motion;Dry needling;Spinal Manipulations;Joint Manipulations    PT Next Visit Plan Review HEP, light stretching, manual techniques, consider NDI    PT Home Exercise Plan Access Code: [email or web address]    Consulted and Agree with Plan of Care Patient              Patient will benefit from skilled therapeutic intervention in order to improve the following deficits and impairments:  Decreased strength, Hypomobility, Pain, Decreased range of motion  Visit Diagnosis: Cervicalgia  Muscle weakness (generalized)     Problem List There are no problems to display for this patient.    PT, DPT, GCS  Betty Daidone, PT 04/07/2021, 10:57 AM  [facility] [address] [place], [place], [place] Phone: [phone]   Fax:  [phone]  Name: LORRAIN RIVERS MRN: Dyanne Iha Date of Birth: 10-17-1929

## 2021-04-13 ENCOUNTER — Ambulatory Visit: Payer: Medicare Other

## 2021-04-13 ENCOUNTER — Other Ambulatory Visit: Payer: Self-pay

## 2021-04-13 DIAGNOSIS — M542 Cervicalgia: Secondary | ICD-10-CM

## 2021-04-13 DIAGNOSIS — M6281 Muscle weakness (generalized): Secondary | ICD-10-CM

## 2021-04-14 NOTE — Therapy (Signed)
[facility] [address]. [place], [place], [address] Phone: [phone]   Fax:  [phone]  Physical Therapy Treatment  Patient Details  Name: Bonnie Henson MRN: [id number] Date of Birth: [birth year] Referring Provider (PT): Dr. Theadore Nan   Encounter Date: 04/13/2021   PT End of Session - 04/14/21 1300     Visit Number 6    Number of Visits 13    Date for PT Re-Evaluation 05/02/21    Authorization Type eval 03/21/21    PT Start Time 1400    PT Stop Time 1445    PT Time Calculation (min) 45 min    Activity Tolerance Patient tolerated treatment well;Patient limited by pain    Behavior During Therapy [facility] for tasks assessed/performed              Past Medical History:  Diagnosis Date   Diabetes mellitus without complication (HCC)    Hyperlipidemia    Hypertension    Neuropathy    Sleep apnea    Thyroid disease     Past Surgical History:  Procedure Laterality Date   ABDOMINAL HYSTERECTOMY     BREAST SURGERY     FRACTURE SURGERY     TONSILLECTOMY      There were no vitals filed for this visit.   Subjective Assessment - 04/13/21 1406     Subjective Pt reports that she is doing alright today. Her neck continues to bother her. She complains of 3/10 resting pain upon arrival and worse when she turns her head. No specific questions currently.    Patient is accompained by: --   Friend   Pertinent History Patient has history of neck pain insidious onset in March 2021. No known MOI the pt stated that "the pain just started one day". Pt also has history of arthritis within her neck. When the pain first began the patient went to her GP who referred her to PT. PT performed STM and periscapular strengthening, but pain did not improve. [facility] which also provided no relief. Pt then went to a neurologist who gave her another shot which helped to alleviate some of the pain. Pt received  a second set of shots from the neurologist within the last few weeks. The second set of shots provided some relief. Pt also uses heat, biofreeze and OTC pain medications to help with the pain. Pain does not affect sleep. Pt reports that she does not notice the pain in the mornings, but it becomes more apparent as the day goes on and she moves her neck more frequently. Pt reports also occasionally having headaches, but they presented before the neck pain and the neck pain does not cause an increase in headaches. Pain from neck will occasionally travel up into L temple and eye, described as a "pressure" behind the eye. 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    Limitations Sitting;Other (comment)   Moving head to look around               TREATMENT   Manual Therapy: Supine cervical P/A mobilizations C2-C5 grade I-II 20s/bout x 1 bout per level Supine cervical lateral glide mobilizations C4-C6 grade I-II 20s/bout x 1 bouts per level bilaterally during PROM lateral flexion; Supine upper trap stretch x 30s hold bilaterally Supine lateral neck flexion stretch x 30s bilaterally;; Suboccipital release x 2 minutes; Gentle cervical traction 10s hold/10s relax  x 3; STM to L upper traps and cervical paraspinals;   There-Ex: Supine repeated cervical retraction 3s hold with overpressure by therapist x 10; Supine manually resisted cervical rotation x 10 bilaterally; Supine manually resisted cervical lateral flexion x 10 bilaterally;   Electrical Stimulation  Combination ultrasound head acting as positive electrode (1.0 MHz, 1.0 W/cm2, continuous, head warmer on) with [employer] estim at pt tolerable level to elicit muscle twitch x 8 minutes to L upper trap;   Pt educated throughout session about proper posture and technique with exercises. Improved exercise technique, movement at target joints, use of target muscles after min to mod verbal, visual, tactile cues.     Focused session on pain control, light stretching, and strengthening. Pt unable to lay prone so performed all mobilizations in supine. Continued with resisted cervical strengthening. Utilized combination ultrasound/estim for trigger point release in L upper trap. Education provided about trigger point dry needling and will consider using at next session pending response to modalities utilized today. Pt would benefit from continued PT services to address deficits in strength, ROM and pain.                     PT Short Term Goals - 03/21/21 1609       PT SHORT TERM GOAL #1   Title Pt will be independent with HEP in order to improve strength and decrease back pain in order to improve pain-free function at home and work.    Time 3    Period Weeks    Status New    Target Date 04/11/21               PT Long Term Goals - 03/21/21 1609       PT LONG TERM GOAL #1   Title Pt will be able to sit for the entirety of a meal without neck pain.    Time 6    Period Weeks    Status New    Target Date 05/02/21      PT LONG TERM GOAL #2   Title Pt's FOTO score will increase to at least a 56 to demonstrate significant improvement in function related to her neck pain    Baseline 03/21/21: 47    Time 6    Period Weeks    Status New    Target Date 05/02/21      PT LONG TERM GOAL #3   Title Pt will increase R neck rotation by at least 10 degree to be symmetrical with the L side in order to be able to turn head while sitting next to others at the dining table.    Baseline L rotation: 35, R rotation: 25    Time 6    Period Weeks    Status New    Target Date 05/02/21      PT LONG TERM GOAL #4   Title Pt will report at least 50% improvement in her neck symptoms in order to perform all of her household and community activities with less pain    Time 6    Period Weeks    Status New    Target Date 05/02/21                   Plan - 04/14/21 1301     Clinical  Impression Statement Focused session on pain control, light stretching, and strengthening. Pt unable to lay prone so performed all mobilizations in supine. Continued with resisted cervical strengthening. Utilized  combination ultrasound/estim for trigger point release in L upper trap. Education provided about trigger point dry needling and will consider using at next session pending response to modalities utilized today. Pt would benefit from continued PT services to address deficits in strength, ROM and pain.    Personal Factors and Comorbidities Age;Time since onset of injury/illness/exacerbation;Past/Current Experience;Comorbidity 3+    Comorbidities Diabetes, neuropathy, OA    Examination-Activity Limitations Dressing;Hygiene/Grooming;Self Feeding    Examination-Participation Restrictions Shop;Community Activity    Stability/Clinical Decision Making Stable/Uncomplicated    Rehab Potential Fair    PT Frequency 2x / week    PT Duration 6 weeks    PT Treatment/Interventions Cryotherapy;Electrical Stimulation;Iontophoresis 4mg /ml Dexamethasone;Moist Heat;Traction;Ultrasound;Therapeutic activities;Therapeutic exercise;Neuromuscular re-education;Manual techniques;Passive range of motion;Dry needling;Spinal Manipulations;Joint Manipulations    PT Next Visit Plan Review HEP, light stretching, manual techniques, consider NDI    PT Home Exercise Plan Access Code: [email or web address]    Consulted and Agree with Plan of Care Patient              Patient will benefit from skilled therapeutic intervention in order to improve the following deficits and impairments:  Decreased strength, Hypomobility, Pain, Decreased range of motion  Visit Diagnosis: Cervicalgia  Muscle weakness (generalized)     Problem List There are no problems to display for this patient.    PT, DPT, GCS  Yamin Swingler, PT 04/14/2021, 1:06 PM   [facility] [address] [place], [place], [place] Phone: [phone]   Fax:  [phone]  Name: BREYAH AKHTER MRN: Dyanne Iha Date of Birth: 1929/05/16

## 2021-04-18 ENCOUNTER — Ambulatory Visit: Payer: Medicare Other

## 2021-04-21 ENCOUNTER — Encounter: Payer: Self-pay | Admitting: Physical Therapy

## 2021-04-21 ENCOUNTER — Ambulatory Visit: Payer: Medicare Other | Admitting: Physical Therapy

## 2021-04-21 ENCOUNTER — Other Ambulatory Visit: Payer: Self-pay

## 2021-04-21 DIAGNOSIS — M542 Cervicalgia: Secondary | ICD-10-CM | POA: Diagnosis not present

## 2021-04-21 DIAGNOSIS — M6281 Muscle weakness (generalized): Secondary | ICD-10-CM

## 2021-04-21 NOTE — Therapy (Signed)
[facility] [address] [place], [place], [address] Phone: [phone]   Fax:  [phone]  Physical Therapy Treatment  Patient Details  Name: Bonnie Henson MRN: [id number] Date of Birth: November 28, 1929 Referring Provider (PT): Dr. Theadore Nan   Encounter Date: 04/21/2021   PT End of Session - 04/23/21 0949     Visit Number 7    Number of Visits 13    Date for PT Re-Evaluation 05/02/21    Authorization Type eval 03/21/21    PT Start Time 1523    PT Stop Time 1609    PT Time Calculation (min) 46 min    Activity Tolerance Patient tolerated treatment well;Patient limited by pain    Behavior During Therapy [facility] for tasks assessed/performed             Past Medical History:  Diagnosis Date   Diabetes mellitus without complication (HCC)    Hyperlipidemia    Hypertension    Neuropathy    Sleep apnea    Thyroid disease     Past Surgical History:  Procedure Laterality Date   ABDOMINAL HYSTERECTOMY     BREAST SURGERY     FRACTURE SURGERY     TONSILLECTOMY      There were no vitals filed for this visit.   Subjective Assessment - 04/23/21 0946     Subjective Pt. reports 10/10 L sided neck pain that is affecting L jaw/ear and eyes.  Pt. Reports the pain has been going on for 2 years.  Pt. Does not think PT is helping. .  Pt. arrived to PT with friend to help answer questions during tx. session.  Pt. HOH during subjective history and hear better in L ear.    Patient is accompained by: --   Friend   Pertinent History Patient has history of neck pain insidious onset in March 2021. No known MOI the pt stated that "the pain just started one day". Pt also has history of arthritis within her neck. When the pain first began the patient went to her GP who referred her to PT. PT performed STM and periscapular strengthening, but pain did not improve. [facility] which also provided no relief. Pt then  went to a neurologist who gave her another shot which helped to alleviate some of the pain. Pt received a second set of shots from the neurologist within the last few weeks. The second set of shots provided some relief. Pt also uses heat, biofreeze and OTC pain medications to help with the pain. Pain does not affect sleep. Pt reports that she does not notice the pain in the mornings, but it becomes more apparent as the day goes on and she moves her neck more frequently. Pt reports also occasionally having headaches, but they presented before the neck pain and the neck pain does not cause an increase in headaches. Pain from neck will occasionally travel up into L temple and eye, described as a "pressure" behind the eye. 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    Limitations Sitting;Other (comment)   Moving head to look around   Currently in Pain? Yes    Pain Score 10-Worst pain ever    Pain Location Neck    Pain Orientation Left    Pain Descriptors / Indicators Aching;Constant    Pain Type Chronic pain  TREATMENT     Manual Therapy:  PT reviewed HEP/ cervical AROM/ educated pt. In proper seated and supine posture to decrease neck tension.   Supine cervical P/A mobilizations C2-C5 grade I-II 20s/bout x 1 bout per level Supine cervical lateral glide mobilizations C4-C6 grade I-II 20s/bout x 1 bouts per level bilaterally during PROM lateral flexion; Supine upper trap stretch 3 x 30s hold bilaterally Supine lateral neck flexion stretch 3 x 30s bilaterally;; Suboccipital release x 2 minutes; Gentle cervical traction 10s hold/10s relax x 3;  MH to neck in supine prior to STM STM to L upper traps and cervical paraspinals in supine and seated posture.        Pt educated throughout session about proper posture and technique with exercises. Improved exercise technique, movement at target joints, use of target muscles after min to mod verbal,  visual, tactile cues.       PT Short Term Goals - 03/21/21 1609       PT SHORT TERM GOAL #1   Title Pt will be independent with HEP in order to improve strength and decrease back pain in order to improve pain-free function at home and work.    Time 3    Period Weeks    Status New    Target Date 04/11/21               PT Long Term Goals - 03/21/21 1609       PT LONG TERM GOAL #1   Title Pt will be able to sit for the entirety of a meal without neck pain.    Time 6    Period Weeks    Status New    Target Date 05/02/21      PT LONG TERM GOAL #2   Title Pt's FOTO score will increase to at least a 56 to demonstrate significant improvement in function related to her neck pain    Baseline 03/21/21: 47    Time 6    Period Weeks    Status New    Target Date 05/02/21      PT LONG TERM GOAL #3   Title Pt will increase R neck rotation by at least 10 degree to be symmetrical with the L side in order to be able to turn head while sitting next to others at the dining table.    Baseline L rotation: 35, R rotation: 25    Time 6    Period Weeks    Status New    Target Date 05/02/21      PT LONG TERM GOAL #4   Title Pt will report at least 50% improvement in her neck symptoms in order to perform all of her household and community activities with less pain    Time 6    Period Weeks    Status New    Target Date 05/02/21                   Plan - 04/23/21 1002     Clinical Impression Statement Pt. presents with forward head, rounded shoulder posture and c/o constant neck pain.  Pt. points to L sided neck tightness/ pain prior to manual tx.  Pt. has moderate cervical stiffness/pain with all planes of movement during supine cervical stretches, esp. rotn./ lateral flexion to R.  PT discussed sleeping posture and proper use of rollator during ambulation to decrease muscle tension/ stress.  Pts. friend reports pt. is under a lot of family stress and nothing  has been improving her  pain.  Pt. may benefit from TDN next tx. session but limited with prone positioning.    Personal Factors and Comorbidities Age;Time since onset of injury/illness/exacerbation;Past/Current Experience;Comorbidity 3+    Comorbidities Diabetes, neuropathy, OA    Examination-Activity Limitations Dressing;Hygiene/Grooming;Self Feeding    Examination-Participation Restrictions Shop;Community Activity    Stability/Clinical Decision Making Stable/Uncomplicated    Clinical Decision Making Low    Rehab Potential Fair    PT Frequency 2x / week    PT Duration 6 weeks    PT Treatment/Interventions Cryotherapy;Electrical Stimulation;Iontophoresis 4mg /ml Dexamethasone;Moist Heat;Traction;Ultrasound;Therapeutic activities;Therapeutic exercise;Neuromuscular re-education;Manual techniques;Passive range of motion;Dry needling;Spinal Manipulations;Joint Manipulations    PT Next Visit Plan Discuss TDN.  Reassess HEP and posture correction.    PT Home Exercise Plan Access Code: [email or web address]    Consulted and Agree with Plan of Care Patient             Patient will benefit from skilled therapeutic intervention in order to improve the following deficits and impairments:  Decreased strength, Hypomobility, Pain, Decreased range of motion  Visit Diagnosis: Cervicalgia  Muscle weakness (generalized)     Problem List There are no problems to display for this patient.  , PT, DPT # [phone] 04/23/2021, 10:29 AM   [facility] [address] [place], [place], [place] Phone: [phone]   Fax:  [phone]  Name: Bonnie Henson MRN: Dyanne Iha Date of Birth: Oct 30, 1929

## 2021-04-25 ENCOUNTER — Other Ambulatory Visit: Payer: Self-pay

## 2021-04-25 ENCOUNTER — Ambulatory Visit: Payer: Medicare Other

## 2021-04-25 DIAGNOSIS — M542 Cervicalgia: Secondary | ICD-10-CM | POA: Diagnosis not present

## 2021-04-25 DIAGNOSIS — M6281 Muscle weakness (generalized): Secondary | ICD-10-CM

## 2021-04-26 NOTE — Therapy (Signed)
[facility] [address] [place], [place], [address] Phone: [phone]   Fax:  [phone]  Physical Therapy Treatment  Patient Details  Name: Bonnie Henson MRN: [id number] Date of Birth: 10/31/1929 Referring Provider (PT): Dr. Theadore Nan   Encounter Date: 04/25/2021   PT End of Session - 04/25/21 1413     Visit Number 8    Number of Visits 13    Date for PT Re-Evaluation 05/02/21    Authorization Type eval 03/21/21    PT Start Time 1403    PT Stop Time 1445    PT Time Calculation (min) 42 min    Activity Tolerance Patient tolerated treatment well;Patient limited by pain    Behavior During Therapy [facility] for tasks assessed/performed              Past Medical History:  Diagnosis Date   Diabetes mellitus without complication (HCC)    Hyperlipidemia    Hypertension    Neuropathy    Sleep apnea    Thyroid disease     Past Surgical History:  Procedure Laterality Date   ABDOMINAL HYSTERECTOMY     BREAST SURGERY     FRACTURE SURGERY     TONSILLECTOMY      There were no vitals filed for this visit.   Subjective Assessment - 04/26/21 1343     Subjective Pt. reports 5/10 L sided neck pain upon arrival. She reports some improvement in her symptoms since last therapy session but does have difficulty remembering.  Overall she does not the like physical therapy has achieved any lasting improvement in her neck pain.    Patient is accompained by: --   Friend   Pertinent History Patient has history of neck pain insidious onset in March 2021. No known MOI the pt stated that "the pain just started one day". Pt also has history of arthritis within her neck. When the pain first began the patient went to her GP who referred her to PT. PT performed STM and periscapular strengthening, but pain did not improve. [facility] which also provided no relief. Pt then went to a neurologist who gave her  another shot which helped to alleviate some of the pain. Pt received a second set of shots from the neurologist within the last few weeks. The second set of shots provided some relief. Pt also uses heat, biofreeze and OTC pain medications to help with the pain. Pain does not affect sleep. Pt reports that she does not notice the pain in the mornings, but it becomes more apparent as the day goes on and she moves her neck more frequently. Pt reports also occasionally having headaches, but they presented before the neck pain and the neck pain does not cause an increase in headaches. Pain from neck will occasionally travel up into L temple and eye, described as a "pressure" behind the eye. 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    Limitations Sitting;Other (comment)   Moving head to look around               TREATMENT   Manual Therapy: Supine cervical P/A mobilizations C2-C5 grade I-II 20s/bout x 1 bout per level Supine cervical lateral glide mobilizations C4-C6 grade I-II 20s/bout x 1 bouts per level bilaterally during PROM lateral flexion; Supine upper trap stretch x 30s hold bilaterally Supine lateral neck flexion stretch x 30s bilaterally;; Suboccipital  release x 2 minutes; Gentle cervical traction 10s hold/10s relax x 3;   Electrical Stimulation  Combination ultrasound head acting as positive electrode (1.0 MHz, 1.0 W/cm2, continuous, head warmer on) with [employer] estim at pt tolerable level to elicit muscle twitch x 8 minutes to L upper trap;   Pt educated throughout session about proper posture and technique with exercises. Improved exercise technique, movement at target joints, use of target muscles after min to mod verbal, visual, tactile cues.    Focused session on pain control, light stretching, and strengthening.  Initially agreed to trigger point dry needling during session today however prior to initiation patient states that she would like to  wait and defer to another visit.  Repeated combination ultrasound/e-stim for trigger point management to left upper trap.  Also continued with manual techniques.  Following combination ultrasound/e-stim patient reports 0/10 pain.  Therapist directed patient in 2 sets of 10 seated scapular retraction and afterwards patient reported increase in pain to 8/10.  Patient's pain is highly variable throughout session and therapist has yet to find a means of long term pain reduction. Patient would like to see a separate therapist in the office for a few sessions to see if she is able to achieve more relief from her pain. Will consider using trigger point dry needling\ if pt is agreeable. Pt would benefit from continued PT services to address deficits in strength, ROM and pain.                     PT Short Term Goals - 03/21/21 1609       PT SHORT TERM GOAL #1   Title Pt will be independent with HEP in order to improve strength and decrease back pain in order to improve pain-free function at home and work.    Time 3    Period Weeks    Status New    Target Date 04/11/21               PT Long Term Goals - 03/21/21 1609       PT LONG TERM GOAL #1   Title Pt will be able to sit for the entirety of a meal without neck pain.    Time 6    Period Weeks    Status New    Target Date 05/02/21      PT LONG TERM GOAL #2   Title Pt's FOTO score will increase to at least a 56 to demonstrate significant improvement in function related to her neck pain    Baseline 03/21/21: 47    Time 6    Period Weeks    Status New    Target Date 05/02/21      PT LONG TERM GOAL #3   Title Pt will increase R neck rotation by at least 10 degree to be symmetrical with the L side in order to be able to turn head while sitting next to others at the dining table.    Baseline L rotation: 35, R rotation: 25    Time 6    Period Weeks    Status New    Target Date 05/02/21      PT LONG TERM GOAL #4   Title  Pt will report at least 50% improvement in her neck symptoms in order to perform all of her household and community activities with less pain    Time 6    Period Weeks    Status New  Target Date 05/02/21                   Plan - 04/25/21 1413     Clinical Impression Statement Focused session on pain control, light stretching, and strengthening.  Initially agreed to trigger point dry needling during session today however prior to initiation patient states that she would like to wait and defer to another visit.  Repeated combination ultrasound/e-stim for trigger point management to left upper trap.  Also continued with manual techniques.  Following combination ultrasound/e-stim patient reports 0/10 pain.  Therapist directed patient in 2 sets of 10 seated scapular retraction and afterwards patient reported increase in pain to 8/10.  Patient's pain is highly variable throughout session and therapist has yet to find a means of long term pain reduction. Patient would like to see a separate therapist in the office for a few sessions to see if she is able to achieve more relief from her pain. Will consider using trigger point dry needling\ if pt is agreeable. Pt would benefit from continued PT services to address deficits in strength, ROM and pain.    Personal Factors and Comorbidities Age;Time since onset of injury/illness/exacerbation;Past/Current Experience;Comorbidity 3+    Comorbidities Diabetes, neuropathy, OA    Examination-Activity Limitations Dressing;Hygiene/Grooming;Self Feeding    Examination-Participation Restrictions Shop;Community Activity    Stability/Clinical Decision Making Stable/Uncomplicated    Rehab Potential Fair    PT Frequency 2x / week    PT Duration 6 weeks    PT Treatment/Interventions Cryotherapy;Electrical Stimulation;Iontophoresis 4mg /ml Dexamethasone;Moist Heat;Traction;Ultrasound;Therapeutic activities;Therapeutic exercise;Neuromuscular re-education;Manual  techniques;Passive range of motion;Dry needling;Spinal Manipulations;Joint Manipulations    PT Next Visit Plan Discuss TDN.  Reassess HEP and posture correction.    PT Home Exercise Plan Access Code: [email or web address]    Consulted and Agree with Plan of Care Patient              Patient will benefit from skilled therapeutic intervention in order to improve the following deficits and impairments:  Decreased strength, Hypomobility, Pain, Decreased range of motion  Visit Diagnosis: Cervicalgia  Muscle weakness (generalized)     Problem List There are no problems to display for this patient.    PT, DPT, GCS  Bonnie Henson, PT 04/26/2021, 1:50 PM  [facility] [address] [place], [place], [place] Phone: [phone]   Fax:  [phone]  Name: Bonnie Henson MRN: Dyanne Iha Date of Birth: 05-30-1929

## 2021-04-27 ENCOUNTER — Other Ambulatory Visit: Payer: Self-pay

## 2021-04-27 ENCOUNTER — Ambulatory Visit: Payer: Medicare Other | Attending: Unknown Physician Specialty | Admitting: Physical Therapy

## 2021-04-27 DIAGNOSIS — M542 Cervicalgia: Secondary | ICD-10-CM | POA: Insufficient documentation

## 2021-04-27 DIAGNOSIS — M6281 Muscle weakness (generalized): Secondary | ICD-10-CM | POA: Insufficient documentation

## 2021-04-27 IMAGING — US US EXTREM LOW VENOUS*R*
1 series · 13 of 24 positions shown · non-contrast
Comparison: None.

CLINICAL DATA: 89-year-old with right calf pain.



[Series 1: us extrem low venous*right* · 0.08mm/px · 13 of 41 slices shown]
[im 1/41]
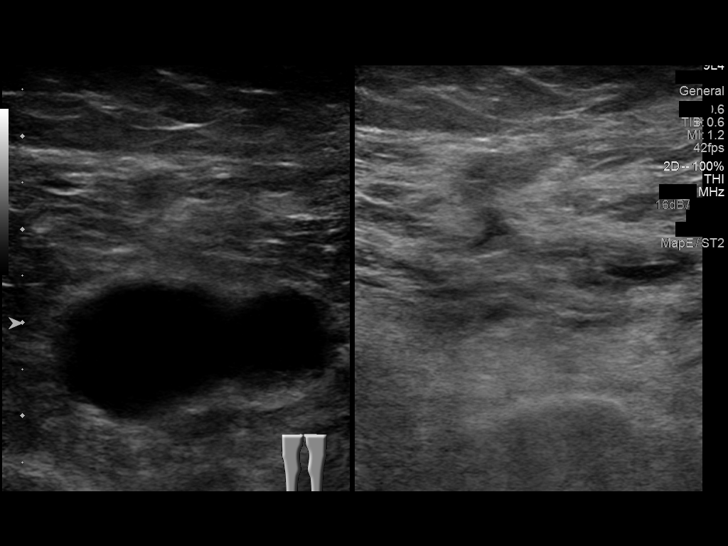
[im 4/41]
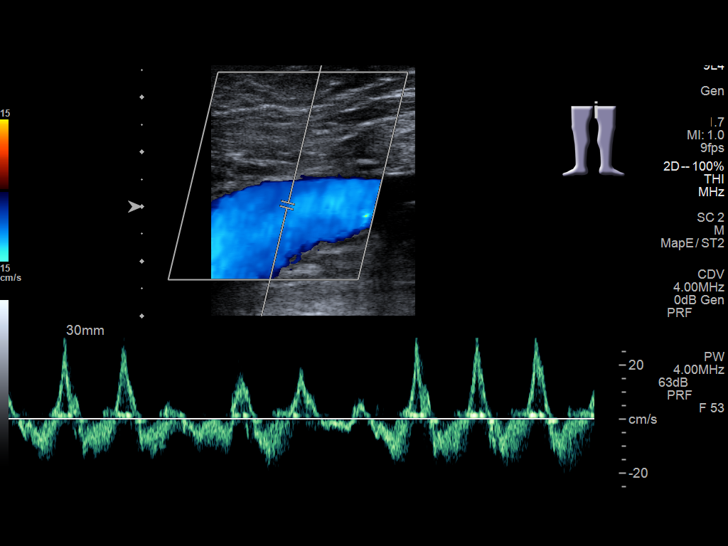
[im 7/41]
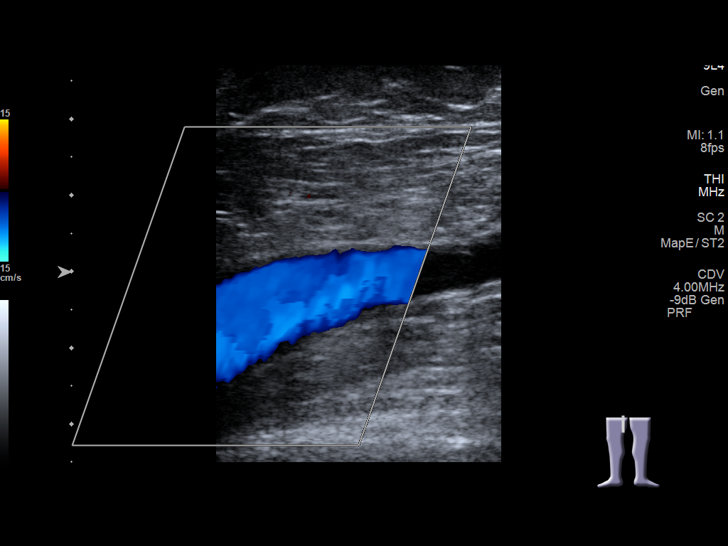
[im 11/41]
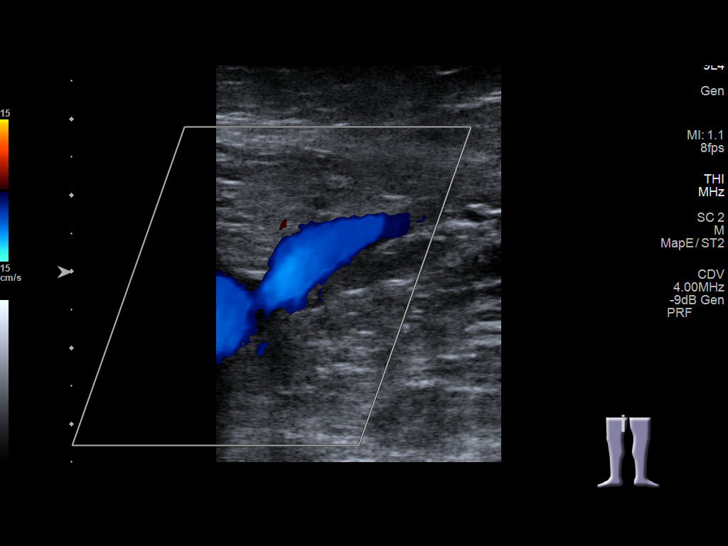
[im 14/41]
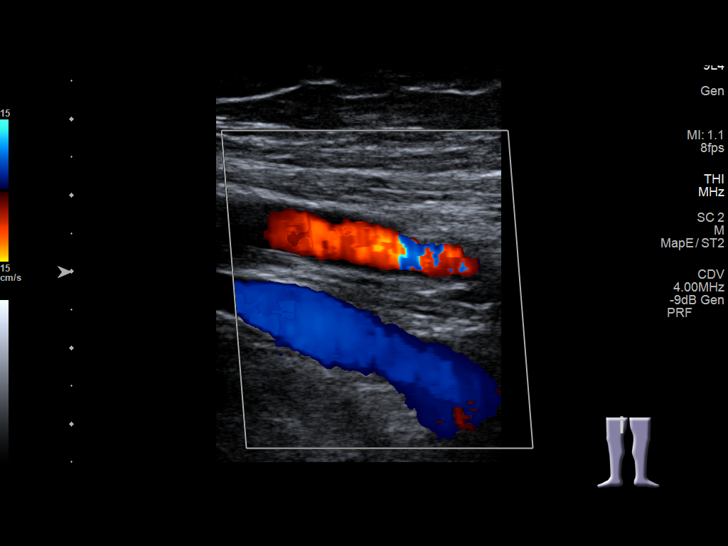
[im 18/41]
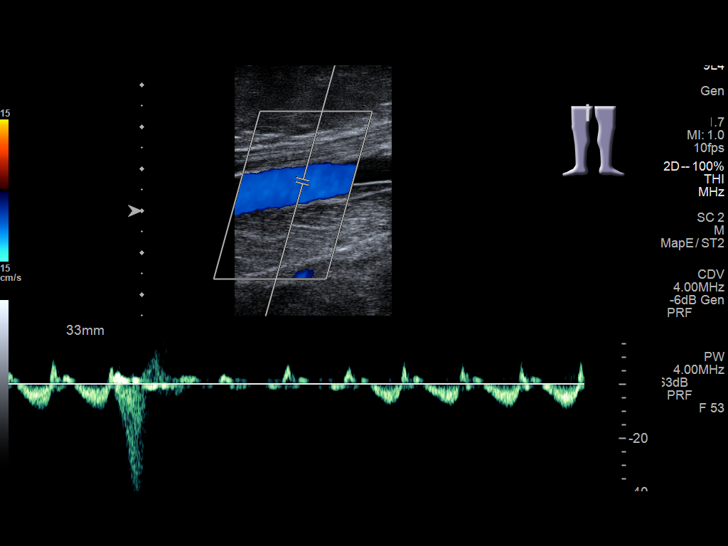
[im 21/41]
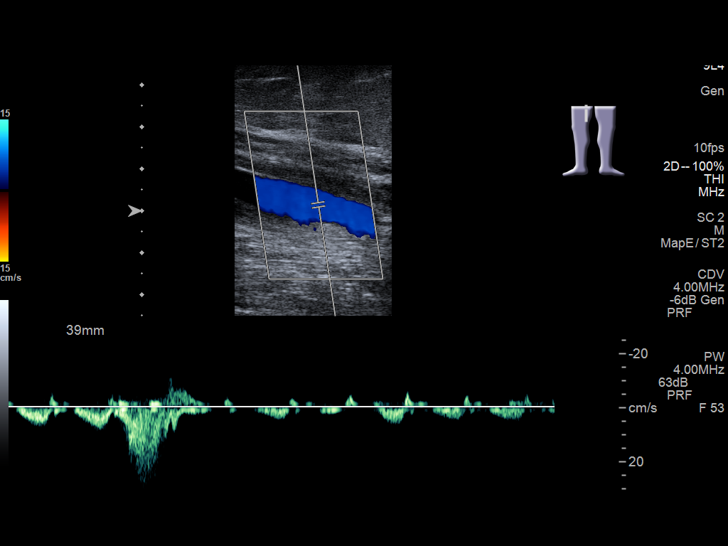
[im 23/41]
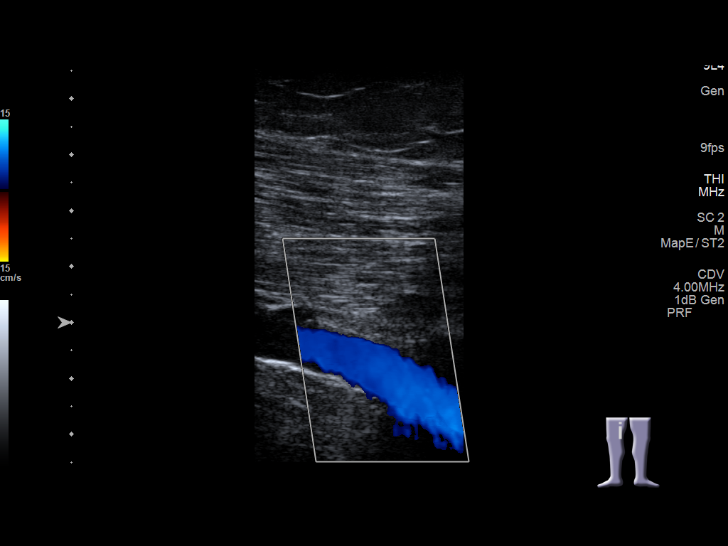
[im 27/41]
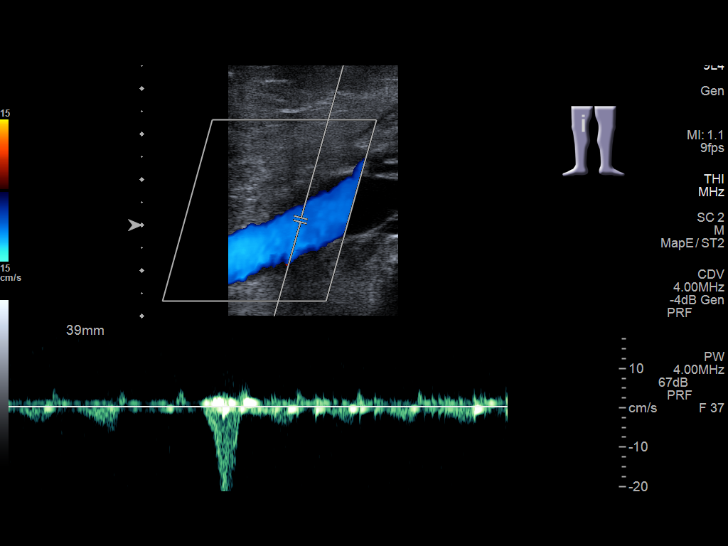
[im 30/41]
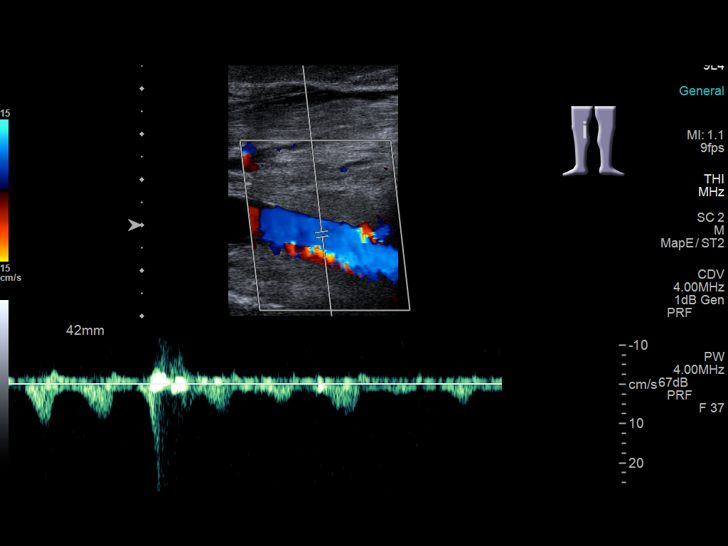
[im 34/41]
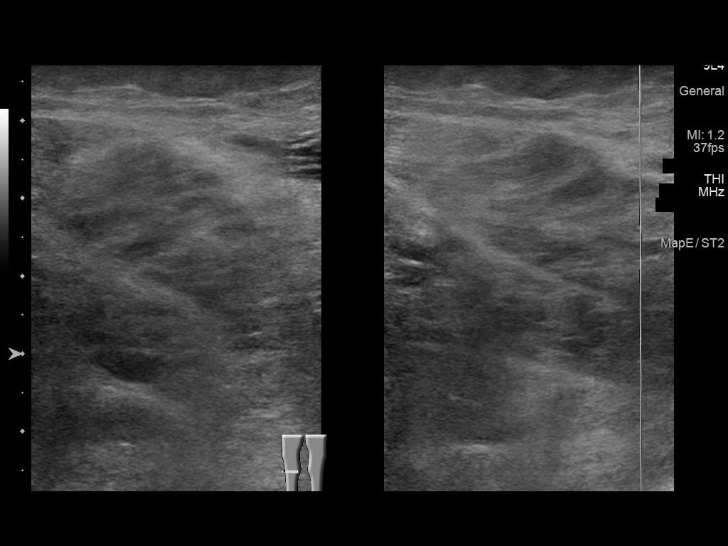
[im 37/41]
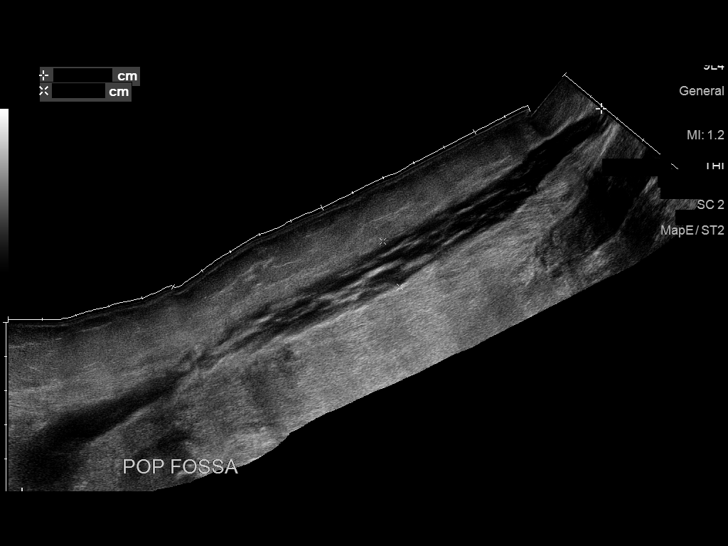
[im 41/41]
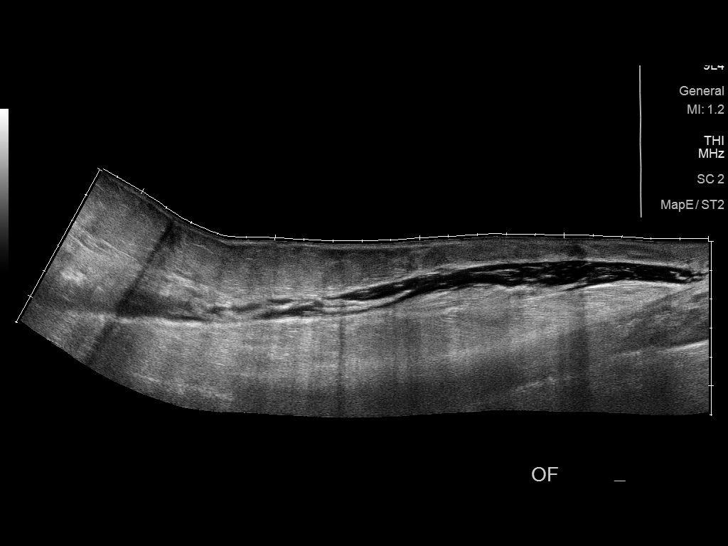

[13 of 24 positions shown; findings below may reference images not displayed]

FINDINGS: Contralateral Common Femoral Vein: Respiratory phasicity is normal
and symmetric with the symptomatic side. No evidence of thrombus.
Normal compressibility.

Common Femoral Vein: No evidence of thrombus. Normal
compressibility, respiratory phasicity and response to augmentation.

Saphenofemoral Junction: No evidence of thrombus. Normal
compressibility and flow on color Doppler imaging.

Profunda Femoral Vein: No evidence of thrombus. Normal
compressibility and flow on color Doppler imaging.

Femoral Vein: No evidence of thrombus. Normal compressibility,
respiratory phasicity and response to augmentation.

Popliteal Vein: No evidence of thrombus. Normal compressibility,
respiratory phasicity and response to augmentation.

Calf Veins: No evidence of thrombus. Normal compressibility and flow
on color Doppler imaging.

Other Findings: Poorly defined hypoechoic collection in the right
popliteal fossa extending down the right calf. This collection
measures 20.4 cm in length.
IMPRESSION: 1.  Negative for deep venous thrombosis in right lower extremity.
2. Poorly-defined fluid in the right popliteal fossa extending down
the right calf. Findings could be associated with a ruptured Baker's
cyst or soft tissue injury.

## 2021-04-28 ENCOUNTER — Ambulatory Visit: Payer: Medicare Other | Admitting: Physical Therapy

## 2021-05-02 NOTE — Therapy (Signed)
[place] ?[facility] ?[address] [place], [place], [address] ?Phone: [phone]   Fax:  [phone] ? ?Physical Therapy Treatment ? ?Patient Details  ?Name: Bonnie Henson ?MRN: [id number] ?Date of Birth: 01/31/1930 ?Referring Provider (PT): Dr. Algie Coffer ? ? ?Encounter Date: 04/27/2021 ? ? PT End of Session - 05/02/21 0815   ? ? Visit Number 9   ? Number of Visits 13   ? Date for PT Re-Evaluation 05/02/21   ? Authorization Type eval 03/21/21   ? PT Start Time 1351   ? PT Stop Time 8676   ? PT Time Calculation (min) 43 min   ? Activity Tolerance Patient tolerated treatment well;Patient limited by pain   ? Behavior During Therapy [facility] for tasks assessed/performed   ? ?  ?  ? ?  ? ? ?Past Medical History:  ?Diagnosis Date  ? Diabetes mellitus without complication ([place])   ? Hyperlipidemia   ? Hypertension   ? Neuropathy   ? Sleep apnea   ? Thyroid disease   ? ? ?Past Surgical History:  ?Procedure Laterality Date  ? ABDOMINAL HYSTERECTOMY    ? BREAST SURGERY    ? FRACTURE SURGERY    ? TONSILLECTOMY    ? ? ?There were no vitals filed for this visit. ? ? Subjective Assessment - 05/02/21 0816   ? ? Subjective Pt. c/o persistent neck pain (L side > R side), esp. with cervical rotation/ increase activity.   ? Patient is accompained by: --   Friend  ? Pertinent History Patient has history of neck pain insidious onset in March 2021. No known MOI the pt stated that "the pain just started one day". Pt also has history of arthritis within her neck. When the pain first began the patient went to her GP who referred her to PT. PT performed STM and periscapular strengthening, but pain did not improve. [facility] which also provided no relief. Pt then went to a neurologist who gave her another shot which helped to alleviate some of the pain. Pt received a second set of shots from the neurologist within the last few weeks. The second set of shots  provided some relief. Pt also uses heat, biofreeze and OTC pain medications to help with the pain. Pain does not affect sleep. Pt reports that she does not notice the pain in the mornings, but it becomes more apparent as the day goes on and she moves her neck more frequently. Pt reports also occasionally having headaches, but they presented before the neck pain and the neck pain does not cause an increase in headaches. Pain from neck will occasionally travel up into L temple and eye, described as a "pressure" behind the eye. 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   ? Limitations Sitting;Other (comment)   Moving head to look around  ? Patient Stated Goals Decrease neck pain   ? Currently in Pain? Yes   ? Pain Score 5    ? Pain Location Neck   ? Pain Orientation Left   ? ?  ?  ? ?  ? ? ? ?TREATMENT ?  ?Therex: ? ?Seated B shoulder AAROM with wand: flexion/ chest press 20x each (limited ROM noted) ?Seated cervical AROM all planes.  Reassessment of scap. Retraction/ chin tuck.  ?  ? ?Manual Therapy: ?  ?Supine cervical P/A mobilizations C2-C5 grade I-II 20s/bout x 1  bout per level ?Supine cervical lateral glide mobilizations C4-C6 grade I-II 20s/bout x 1 bouts per level bilaterally during PROM lateral flexion; ?Supine upper trap stretch 3 x 30s hold bilaterally ?Supine lateral neck flexion stretch 3 x 30s bilaterally;; ?Suboccipital release x 2 minutes; ?Gentle cervical traction 10s hold/10s relax x 3; ?  ?MH to neck in supine prior to STM ?STM to L upper traps and cervical paraspinals in supine and seated posture.   ?  ?  ?  ?  ?Pt educated throughout session about proper posture and technique with exercises. Improved exercise technique, movement at target joints, use of target muscles after min to mod verbal, visual, tactile cues.  ? ? ? ? PT Short Term Goals - 05/02/21 0833   ? ?  ? PT SHORT TERM GOAL #1  ? Title Pt will be independent with HEP in order to improve strength and  decrease back pain in order to improve pain-free function at home and work.   ? Time 3   ? Period Weeks   ? Status Partially Met   ? Target Date 04/27/21   ? ?  ?  ? ?  ? ? ? ? PT Long Term Goals - 03/21/21 1609   ? ?  ? PT LONG TERM GOAL #1  ? Title Pt will be able to sit for the entirety of a meal without neck pain.   ? Time 6   ? Period Weeks   ? Status New   ? Target Date 05/02/21   ?  ? PT LONG TERM GOAL #2  ? Title Pt's FOTO score will increase to at least a 56 to demonstrate significant improvement in function related to her neck pain   ? Baseline 03/21/21: 47   ? Time 6   ? Period Weeks   ? Status New   ? Target Date 05/02/21   ?  ? PT LONG TERM GOAL #3  ? Title Pt will increase R neck rotation by at least 10 degree to be symmetrical with the L side in order to be able to turn head while sitting next to others at the dining table.   ? Baseline L rotation: 35, R rotation: 25   ? Time 6   ? Period Weeks   ? Status New   ? Target Date 05/02/21   ?  ? PT LONG TERM GOAL #4  ? Title Pt will report at least 50% improvement in her neck symptoms in order to perform all of her household and community activities with less pain   ? Time 6   ? Period Weeks   ? Status New   ? Target Date 05/02/21   ? ?  ?  ? ?  ? ? ? ? ? ? ? ? Plan - 05/02/21 0818   ? ? Clinical Impression Statement PT focused tx. on correcting upright posture/ proper head position in sitting/ supine.  B shoulder flexion limited due to pts. reporting limitations/ history of dislocations.  Pt. reports no increase c/o neck pain during tx. and moderate cervicala/ UT musculature tightness and trigger points noted.  Good tolerance to use of MH and STM in sitting/ supine position.  Pt. unable to tolerate prone position on mat table at this time.  PT will reassess goals next tx. session to determine progress/ POC.   ? Personal Factors and Comorbidities Age;Time since onset of injury/illness/exacerbation;Past/Current Experience;Comorbidity 3+   ? Comorbidities  Diabetes, neuropathy, OA   ? Examination-Activity Limitations  Dressing;Hygiene/Grooming;Self Feeding   ? Examination-Participation Restrictions Shop;Community Activity   ? Stability/Clinical Decision Making Stable/Uncomplicated   ? Clinical Decision Making Low   ? Rehab Potential Fair   ? PT Frequency 2x / week   ? PT Duration 6 weeks   ? PT Treatment/Interventions Cryotherapy;Electrical Stimulation;Iontophoresis 47m/ml Dexamethasone;Moist Heat;Traction;Ultrasound;Therapeutic activities;Therapeutic exercise;Neuromuscular re-education;Manual techniques;Passive range of motion;Dry needling;Spinal Manipulations;Joint Manipulations   ? PT Next Visit Plan Discuss TDN.  Reassess HEP and posture correction.   10th visit progress note/ CHECK GOALS   ? PT Home Exercise Plan Access Code: [email or web address]  ? Consulted and Agree with Plan of Care Patient   ? ?  ?  ? ?  ? ? ?Patient will benefit from skilled therapeutic intervention in order to improve the following deficits and impairments:  Decreased strength, Hypomobility, Pain, Decreased range of motion ? ?Visit Diagnosis: ?Cervicalgia ? ?Muscle weakness (generalized) ? ? ? ? ?Problem List ?There are no problems to display for this patient. ? ?MPura Spice PT, DPT # [phone]?05/02/2021, 8:34 AM ? ?[place] [facility] ?[address] ?[place] [address]?Phone: [phone]  Fax:  [phone]? ?Name: VLeonia Henson?MRN: [id number]?Date of Birth: 03/02/1929-07-11? ? ? ?

## 2021-05-03 ENCOUNTER — Ambulatory Visit: Payer: Medicare Other | Admitting: Physical Therapy

## 2021-05-03 ENCOUNTER — Encounter: Payer: Self-pay | Admitting: Physical Therapy

## 2021-05-03 ENCOUNTER — Other Ambulatory Visit: Payer: Self-pay

## 2021-05-03 DIAGNOSIS — M542 Cervicalgia: Secondary | ICD-10-CM

## 2021-05-03 NOTE — Therapy (Addendum)
[place] ?[facility] ?[address]. [place], [place], [address] ?Phone: [phone]   Fax:  [phone] ? ?Physical Therapy Treatment ?Physical Therapy Progress Note ? ? ?Dates of reporting period  03/21/21   to  05/03/21 ? ?Patient Details  ?Name: Bonnie Henson ?MRN: [id number] ?Date of Birth: 06-Dec-1929 ?Referring Provider (PT): Dr. Algie Coffer ? ? ?Encounter Date: 05/03/2021 ? ? PT End of Session - 05/04/21 0939   ? ? Visit Number 10   ? Number of Visits 18   ? Date for PT Re-Evaluation 05/31/21   ? Authorization Type eval 03/21/21   ? PT Start Time 1418   ? PT Stop Time 1510   ? PT Time Calculation (min) 52 min   ? Activity Tolerance Patient tolerated treatment well;Patient limited by pain   ? Behavior During Therapy [facility] for tasks assessed/performed   ? ?  ?  ? ?  ? ? ?Past Medical History:  ?Diagnosis Date  ? Diabetes mellitus without complication ([place])   ? Hyperlipidemia   ? Hypertension   ? Neuropathy   ? Sleep apnea   ? Thyroid disease   ? ? ?Past Surgical History:  ?Procedure Laterality Date  ? ABDOMINAL HYSTERECTOMY    ? BREAST SURGERY    ? FRACTURE SURGERY    ? TONSILLECTOMY    ? ? ?There were no vitals filed for this visit. ? ? Subjective Assessment - 05/04/21 0929   ? ? Subjective Pt. arrived to PT with no new complaints.  Pt. brought in home traction unit (inflatable).  PT instructed in proper set up/ use.  Pt. reports neck pain has been "manageable" over the weekend.  Pt. reports a constant ache on L side of neck.   ? Patient is accompained by: --   Friend  ? Pertinent History Patient has history of neck pain insidious onset in March 2021. No known MOI the pt stated that "the pain just started one day". Pt also has history of arthritis within her neck. When the pain first began the patient went to her GP who referred her to PT. PT performed STM and periscapular strengthening, but pain did not improve. [facility] which also  provided no relief. Pt then went to a neurologist who gave her another shot which helped to alleviate some of the pain. Pt received a second set of shots from the neurologist within the last few weeks. The second set of shots provided some relief. Pt also uses heat, biofreeze and OTC pain medications to help with the pain. Pain does not affect sleep. Pt reports that she does not notice the pain in the mornings, but it becomes more apparent as the day goes on and she moves her neck more frequently. Pt reports also occasionally having headaches, but they presented before the neck pain and the neck pain does not cause an increase in headaches. Pain from neck will occasionally travel up into L temple and eye, described as a "pressure" behind the eye. 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   ? Limitations Sitting;Other (comment)   Moving head to look around  ? Patient Stated Goals Decrease neck pain   ? Currently in Pain? Yes   ? Pain Score 4    ? Pain Location Neck   ? Pain Orientation Left   ? Pain Descriptors / Indicators Aching;Constant   ? ?  ?  ? ?  ? ? ? ?  ?  Supine wand ex. Sh. Flexion/ chest press ? ? ? ? ?TREATMENT ?  ?Therex: ?  ?Supine B shoulder AAROM with wand: flexion/ chest press 20x each (limited ROM noted in sh. Due to h/o sh. dislocations) ?Seated cervical AROM all planes.  Seated scap. Retraction/ chin tucks 10x each.   ?  ?  ?Manual Therapy: ?  ?Supine cervical P/A mobilizations C2-C5 grade I-II 20s/bout x 1 bout per level ?Supine cervical lateral glide mobilizations C4-C6 grade I-II 20s/bout x 1 bouts per level bilaterally during PROM lateral flexion; ?Supine upper trap stretch 3 x 30s hold bilaterally ?Supine lateral neck flexion stretch 3 x 30s bilaterally;; ?Suboccipital release x 2 minutes; ?Gentle cervical traction 10s hold/10s relax x 3; ?STM to L upper traps and cervical paraspinals in supine and seated posture.   ?  ?Seated traction with pts. Personal  traction unit (inflatable)- instructed pt. In proper set up/ use  ?  ?  ?Pt educated throughout session about proper posture and technique with exercises. Improved exercise technique, movement at target joints, use of target muscles after min to mod verbal, visual, tactile cues.  ?  ? ? ? ? PT Short Term Goals - 05/04/21 0951   ? ?  ? PT SHORT TERM GOAL #1  ? Title Pt will be independent with HEP in order to improve strength and decrease back pain in order to improve pain-free function at home and work.   ? Time 3   ? Period Weeks   ? Status Achieved   ? Target Date 05/03/21   ? ?  ?  ? ?  ? ? ? ? PT Long Term Goals - 05/04/21 0952   ? ?  ? PT LONG TERM GOAL #1  ? Title Pt will be able to sit for the entirety of a meal without neck pain.   ? Baseline neck pain varies t/o day   ? Time 4   ? Period Weeks   ? Status Partially Met   ? Target Date 05/31/21   ?  ? PT LONG TERM GOAL #2  ? Title Pt's FOTO score will increase to at least a 56 to demonstrate significant improvement in function related to her neck pain   ? Baseline 03/21/21: 47   ? Time 6   ? Period Weeks   ? Status On-going   ? Target Date 05/31/21   ?  ? PT LONG TERM GOAL #3  ? Title Pt will increase R neck rotation by at least 10 degree to be symmetrical with the L side in order to be able to turn head while sitting next to others at the dining table.   ? Baseline L rotation: 35, R rotation: 25   ? Time 6   ? Period Weeks   ? Status Not Met   ? Target Date 05/31/21   ?  ? PT LONG TERM GOAL #4  ? Title Pt will report at least 50% improvement in her neck symptoms in order to perform all of her household and community activities with less pain   ? Time 6   ? Period Weeks   ? Status Partially Met   ? Target Date 05/31/21   ? ?  ?  ? ?  ? ? ? ? ? ? ? ? Plan - 05/04/21 0941   ? ? Clinical Impression Statement PT tx. focus remains on posture correction/cervical stretches to improve neck pain and functional mobility.  Pt. presents with moderate cervical  hypomobility,  esp. with lateral flexion/ rotn.  Muscle tightness remains in upper thoracic/ cervical musculature during manual tx./ STM.    Pt. tolerates manual traction/ AAROM to c-spine in a pain tolerable range.  Moderate verbal cuing/ instruction to correct upright posture/ head position in sitting.  See updated PT goals.  Pt. will continue to benefit from skilled PT services to decrease overall neck pain to improve functional mobility.   ? Personal Factors and Comorbidities Age;Time since onset of injury/illness/exacerbation;Past/Current Experience;Comorbidity 3+   ? Comorbidities Diabetes, neuropathy, OA   ? Examination-Activity Limitations Dressing;Hygiene/Grooming;Self Feeding   ? Examination-Participation Restrictions Shop;Community Activity   ? Stability/Clinical Decision Making Stable/Uncomplicated   ? Clinical Decision Making Low   ? Rehab Potential Fair   ? PT Frequency 2x / week   ? PT Duration 6 weeks   ? PT Treatment/Interventions Cryotherapy;Electrical Stimulation;Iontophoresis 67m/ml Dexamethasone;Moist Heat;Traction;Ultrasound;Therapeutic activities;Therapeutic exercise;Neuromuscular re-education;Manual techniques;Passive range of motion;Dry needling;Spinal Manipulations;Joint Manipulations   ? PT Next Visit Plan Discuss TDN.  Add gentle strengthening ex.   ? PT Home Exercise Plan Access Code: [email or web address]  ? Consulted and Agree with Plan of Care Patient   ? ?  ?  ? ?  ? ? ?Patient will benefit from skilled therapeutic intervention in order to improve the following deficits and impairments:  Decreased strength, Hypomobility, Pain, Decreased range of motion ? ?Visit Diagnosis: ?Cervicalgia ? ? ? ? ?Problem List ?There are no problems to display for this patient. ? ?MPura Spice PT, DPT # [phone]?05/04/2021, 9:54 AM ? ?[place] ?[facility] ?[address] ?[place] [address]?Phone: [phone]  Fax:  [phone]? ?Name: VLeonia Henson?MRN:  [id number]?Date of Birth: 9Jun 23, 1931? ? ? ?

## 2021-05-05 ENCOUNTER — Other Ambulatory Visit: Payer: Self-pay

## 2021-05-05 ENCOUNTER — Ambulatory Visit: Payer: Medicare Other | Admitting: Physical Therapy

## 2021-05-05 DIAGNOSIS — M542 Cervicalgia: Secondary | ICD-10-CM | POA: Diagnosis not present

## 2021-05-05 DIAGNOSIS — M6281 Muscle weakness (generalized): Secondary | ICD-10-CM

## 2021-05-07 NOTE — Therapy (Addendum)
[place] ?[facility] ?[address]. [place], [place], [address] ?Phone: [phone]   Fax:  [phone] ? ?Physical Therapy Treatment ? ?Patient Details  ?Name: Bonnie Henson ?MRN: [id number] ?Date of Birth: March 26, 1929 ?Referring Provider (PT): Dr. Algie Coffer ? ? ?Encounter Date: 05/05/2021 ? ? PT End of Session - 05/07/21 1051   ? ? Visit Number 11   ? Number of Visits 18   ? Date for PT Re-Evaluation 05/31/21   ? Authorization Type eval 03/21/21   ? PT Start Time 1443   ? PT Stop Time 1540   ? PT Time Calculation (min) 46 min   ? Activity Tolerance Patient tolerated treatment well;Patient limited by pain   ? Behavior During Therapy [facility] for tasks assessed/performed   ? ?  ?  ? ?  ? ? ?Past Medical History:  ?Diagnosis Date  ? Diabetes mellitus without complication ([place])   ? Hyperlipidemia   ? Hypertension   ? Neuropathy   ? Sleep apnea   ? Thyroid disease   ? ? ?Past Surgical History:  ?Procedure Laterality Date  ? ABDOMINAL HYSTERECTOMY    ? BREAST SURGERY    ? FRACTURE SURGERY    ? TONSILLECTOMY    ? ? ?There were no vitals filed for this visit. ? ? Subjective Assessment - 05/08/21 1414   ? ? Subjective Pt. entered PT wtih c/o L sided next pain.  Pt. states her pain is not too bad at this moment.   ? Patient is accompained by: --   Friend  ? Pertinent History Patient has history of neck pain insidious onset in March 2021. No known MOI the pt stated that "the pain just started one day". Pt also has history of arthritis within her neck. When the pain first began the patient went to her GP who referred her to PT. PT performed STM and periscapular strengthening, but pain did not improve. [facility] which also provided no relief. Pt then went to a neurologist who gave her another shot which helped to alleviate some of the pain. Pt received a second set of shots from the neurologist within the last few weeks. The second set of shots  provided some relief. Pt also uses heat, biofreeze and OTC pain medications to help with the pain. Pain does not affect sleep. Pt reports that she does not notice the pain in the mornings, but it becomes more apparent as the day goes on and she moves her neck more frequently. Pt reports also occasionally having headaches, but they presented before the neck pain and the neck pain does not cause an increase in headaches. Pain from neck will occasionally travel up into L temple and eye, described as a "pressure" behind the eye. 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   ? Limitations Sitting;Other (comment)   Moving head to look around  ? Patient Stated Goals Decrease neck pain   ? Currently in Pain? Yes   ? Pain Score 4    ? Pain Location Neck   ? Pain Orientation Left   ? ?  ?  ? ?  ? ? ? ? ?TREATMENT ?  ?MH to L/R side of neck in sitting prior to tx. Session in chair with back support.  ? ?Therex: ?  ?Supine B shoulder AAROM with wand: flexion/ chest press 20x each (limited ROM noted in sh. Due to  h/o sh. dislocations) ?Seated cervical AROM all planes.  Seated scap. Retraction/ chin tucks 10x each.   ?Discussed home traction (inflatable unit).  ?  ?Manual Therapy: ?  ?Supine cervical P/A mobilizations C2-C5 grade I-II 20s/bout x 1 bout per level ?Supine cervical lateral glide mobilizations C4-C6 grade I-II 20s/bout x 1 bouts per level bilaterally during PROM lateral flexion; ?Supine upper trap stretch 3 x 30s hold bilaterally ?Supine lateral neck flexion stretch 3 x 30s bilaterally;; ?Suboccipital release x 2 minutes; ?Gentle cervical traction 10s hold/10s relax x 3; ?STM to L upper traps and cervical paraspinals in supine and seated posture.   ?  ? ? ? ? PT Short Term Goals - 05/04/21 0951   ? ?  ? PT SHORT TERM GOAL #1  ? Title Pt will be independent with HEP in order to improve strength and decrease back pain in order to improve pain-free function at home and work.   ? Time 3   ?  Period Weeks   ? Status Achieved   ? Target Date 05/03/21   ? ?  ?  ? ?  ? ? ? ? PT Long Term Goals - 05/04/21 0952   ? ?  ? PT LONG TERM GOAL #1  ? Title Pt will be able to sit for the entirety of a meal without neck pain.   ? Baseline neck pain varies t/o day   ? Time 4   ? Period Weeks   ? Status Partially Met   ? Target Date 05/31/21   ?  ? PT LONG TERM GOAL #2  ? Title Pt's FOTO score will increase to at least a 56 to demonstrate significant improvement in function related to her neck pain   ? Baseline 03/21/21: 47   ? Time 6   ? Period Weeks   ? Status On-going   ? Target Date 05/31/21   ?  ? PT LONG TERM GOAL #3  ? Title Pt will increase R neck rotation by at least 10 degree to be symmetrical with the L side in order to be able to turn head while sitting next to others at the dining table.   ? Baseline L rotation: 35, R rotation: 25   ? Time 6   ? Period Weeks   ? Status Not Met   ? Target Date 05/31/21   ?  ? PT LONG TERM GOAL #4  ? Title Pt will report at least 50% improvement in her neck symptoms in order to perform all of her household and community activities with less pain   ? Time 6   ? Period Weeks   ? Status Partially Met   ? Target Date 05/31/21   ? ?  ?  ? ?  ? ? ? ? ? ? ? ? Plan - 05/08/21 1419   ? ? Clinical Impression Statement Pt. reports constant L sided neck pain and reports little pain relief with STM/ gentle cervical stretches.  Moderate cervical muscle/ UT tightness during STM and ROM reassessment.  PT continues to educate pt. on use of MH and correction of upright posture/ head position.  Pt. will continue to benefit from skilled PT services to decrease overall neck pain to improve functional mobility.   ? Personal Factors and Comorbidities Age;Time since onset of injury/illness/exacerbation;Past/Current Experience;Comorbidity 3+   ? Comorbidities Diabetes, neuropathy, OA   ? Examination-Activity Limitations Dressing;Hygiene/Grooming;Self Feeding   ? Examination-Participation Restrictions  Shop;Community Activity   ? Stability/Clinical Decision Making Stable/Uncomplicated   ?  Clinical Decision Making Low   ? Rehab Potential Fair   ? PT Frequency 2x / week   ? PT Duration 6 weeks   ? PT Treatment/Interventions Cryotherapy;Electrical Stimulation;Iontophoresis 57m/ml Dexamethasone;Moist Heat;Traction;Ultrasound;Therapeutic activities;Therapeutic exercise;Neuromuscular re-education;Manual techniques;Passive range of motion;Dry needling;Spinal Manipulations;Joint Manipulations   ? PT Next Visit Plan Discuss TDN.  Add gentle strengthening ex.   ? PT Home Exercise Plan Access Code: [email or web address]  ? Consulted and Agree with Plan of Care Patient   ? ?  ?  ? ?  ? ? ?Patient will benefit from skilled therapeutic intervention in order to improve the following deficits and impairments:  Decreased strength, Hypomobility, Pain, Decreased range of motion ? ?Visit Diagnosis: ?Cervicalgia ? ?Muscle weakness (generalized) ? ? ? ? ?Problem List ?There are no problems to display for this patient. ? ?MPura Spice PT, DPT # [phone]?05/08/2021, 5:27 PM ? ?[place] ?[facility] ?[address] ?[place] [address]?Phone: [phone]  Fax:  [phone]? ?Name: VLeonia Henson?MRN: [id number]?Date of Birth: 9June 28, 1931? ? ? ?

## 2021-05-10 ENCOUNTER — Ambulatory Visit: Payer: Medicare Other | Admitting: Physical Therapy

## 2021-05-12 ENCOUNTER — Encounter: Payer: Self-pay | Admitting: Physical Therapy

## 2021-05-12 ENCOUNTER — Ambulatory Visit: Payer: Medicare Other | Admitting: Physical Therapy

## 2021-05-12 ENCOUNTER — Other Ambulatory Visit: Payer: Self-pay

## 2021-05-12 DIAGNOSIS — M542 Cervicalgia: Secondary | ICD-10-CM | POA: Diagnosis not present

## 2021-05-12 DIAGNOSIS — M6281 Muscle weakness (generalized): Secondary | ICD-10-CM

## 2021-05-13 NOTE — Therapy (Signed)
[place] ?[facility] ?[address]. [place], [place], [address] ?Phone: [phone]   Fax:  [phone] ? ?Physical Therapy Treatment ? ?Patient Details  ?Name: Bonnie Henson ?MRN: [id number] ?Date of Birth: 1929/03/10 ?Referring Provider (PT): Dr. Algie Coffer ? ? ?Encounter Date: 05/12/2021 ? ? PT End of Session - 05/13/21 0931   ? ? Visit Number 12   ? Number of Visits 18   ? Date for PT Re-Evaluation 05/31/21   ? Authorization Type eval 03/21/21   ? PT Start Time 1428   ? PT Stop Time 5465   ? PT Time Calculation (min) 46 min   ? Activity Tolerance Patient tolerated treatment well;Patient limited by pain   ? Behavior During Therapy [facility] for tasks assessed/performed   ? ?  ?  ? ?  ? ? ?Past Medical History:  ?Diagnosis Date  ? Diabetes mellitus without complication ([place])   ? Hyperlipidemia   ? Hypertension   ? Neuropathy   ? Sleep apnea   ? Thyroid disease   ? ? ?Past Surgical History:  ?Procedure Laterality Date  ? ABDOMINAL HYSTERECTOMY    ? BREAST SURGERY    ? FRACTURE SURGERY    ? TONSILLECTOMY    ? ? ?There were no vitals filed for this visit. ? ? Subjective Assessment - 05/12/21 1430   ? ? Subjective Pt. entered PT with c/o persistent L sided neck pain.  Pt. states pain radiates into L side of head (rams horn symptoms).  Pt. reports a lot of daily stress and difficulty relaxing.  Pt. discussed posture while sitting in chair/ bed and during reading.   ? Patient is accompained by: --   Friend  ? Pertinent History Patient has history of neck pain insidious onset in March 2021. No known MOI the pt stated that "the pain just started one day". Pt also has history of arthritis within her neck. When the pain first began the patient went to her GP who referred her to PT. PT performed STM and periscapular strengthening, but pain did not improve. [facility] which also provided no relief. Pt then went to a neurologist who gave her another  shot which helped to alleviate some of the pain. Pt received a second set of shots from the neurologist within the last few weeks. The second set of shots provided some relief. Pt also uses heat, biofreeze and OTC pain medications to help with the pain. Pain does not affect sleep. Pt reports that she does not notice the pain in the mornings, but it becomes more apparent as the day goes on and she moves her neck more frequently. Pt reports also occasionally having headaches, but they presented before the neck pain and the neck pain does not cause an increase in headaches. Pain from neck will occasionally travel up into L temple and eye, described as a "pressure" behind the eye. 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   ? Limitations Sitting;Other (comment)   Moving head to look around  ? Patient Stated Goals Decrease neck pain   ? Currently in Pain? Yes   ? Pain Score 4    ? Pain Location Neck   ? Pain Orientation Left   ? Pain Descriptors / Indicators Aching;Constant   ? Pain Type Chronic pain   ? Pain Onset More than a month ago   ? Pain Frequency Intermittent   ? ?  ?  ? ?  ? ? ? ?  TREATMENT ?  ?Therex: ?  ?Supine B shoulder AAROM with PT assist all planes (as tolerated)- limited sh. ROM due to h/o dislocations.  ?Supine 2# L/R shoulder press-ups/ bicep curls/ tricep extension 20x.  ?Seated cervical AROM all planes.    ?  ?Manual Therapy: ?  ?Supine cervical P/A mobilizations C2-C5 grade I-II 20s/bout x 1 bout per level ?Supine cervical lateral glide mobilizations C4-C6 grade I-II 20s/bout x 1 bouts per level bilaterally during PROM lateral flexion (moderate hypomobility/ pain over L mid-upper cervical spine) ?Supine upper trap stretch 3 x 30s hold bilaterally ?Supine lateral neck flexion stretch 3 x 30s bilaterally;; ?Suboccipital release x 2 minutes; ?Gentle cervical traction 10s hold/10s relax x 3; ?STM to L upper traps and cervical paraspinals in supine and seated posture.    ? ? ? ? PT Short Term Goals - 05/04/21 0951   ? ?  ? PT SHORT TERM GOAL #1  ? Title Pt will be independent with HEP in order to improve strength and decrease back pain in order to improve pain-free function at home and work.   ? Time 3   ? Period Weeks   ? Status Achieved   ? Target Date 05/03/21   ? ?  ?  ? ?  ? ? ? ? PT Long Term Goals - 05/04/21 0952   ? ?  ? PT LONG TERM GOAL #1  ? Title Pt will be able to sit for the entirety of a meal without neck pain.   ? Baseline neck pain varies t/o day   ? Time 4   ? Period Weeks   ? Status Partially Met   ? Target Date 05/31/21   ?  ? PT LONG TERM GOAL #2  ? Title Pt's FOTO score will increase to at least a 56 to demonstrate significant improvement in function related to her neck pain   ? Baseline 03/21/21: 47   ? Time 6   ? Period Weeks   ? Status On-going   ? Target Date 05/31/21   ?  ? PT LONG TERM GOAL #3  ? Title Pt will increase R neck rotation by at least 10 degree to be symmetrical with the L side in order to be able to turn head while sitting next to others at the dining table.   ? Baseline L rotation: 35, R rotation: 25   ? Time 6   ? Period Weeks   ? Status Not Met   ? Target Date 05/31/21   ?  ? PT LONG TERM GOAL #4  ? Title Pt will report at least 50% improvement in her neck symptoms in order to perform all of her household and community activities with less pain   ? Time 6   ? Period Weeks   ? Status Partially Met   ? Target Date 05/31/21   ? ?  ?  ? ?  ? ? ? ? ? ? ? ? Plan - 05/13/21 0931   ? ? Clinical Impression Statement L sided neck/ head pain remains persistent t/o tx.  Pt. remains tight with L UT/cervical musculature during STM and stretches.  Pt. requires min. A to returning to sitting on edge of mat table after lying supine during manual tx.  No improvement in L sided neck pain during manual tx. today.  PT educated/ discussed pts. posture and head position while completing daily tasks/ activity.   ? Personal Factors and Comorbidities Age;Time since  onset of injury/illness/exacerbation;Past/Current Experience;Comorbidity  3+   ? Comorbidities Diabetes, neuropathy, OA   ? Examination-Activity Limitations Dressing;Hygiene/Grooming;Self Feeding   ? Examination-Participation Restrictions Shop;Community Activity   ? Stability/Clinical Decision Making Stable/Uncomplicated   ? Clinical Decision Making Low   ? Rehab Potential Fair   ? PT Frequency 2x / week   ? PT Duration 6 weeks   ? PT Treatment/Interventions Cryotherapy;Electrical Stimulation;Iontophoresis 49m/ml Dexamethasone;Moist Heat;Traction;Ultrasound;Therapeutic activities;Therapeutic exercise;Neuromuscular re-education;Manual techniques;Passive range of motion;Dry needling;Spinal Manipulations;Joint Manipulations   ? PT Next Visit Plan Add gentle strengthening ex.   ? PT Home Exercise Plan Access Code: [email or web address]  ? Consulted and Agree with Plan of Care Patient   ? ?  ?  ? ?  ? ? ?Patient will benefit from skilled therapeutic intervention in order to improve the following deficits and impairments:  Decreased strength, Hypomobility, Pain, Decreased range of motion ? ?Visit Diagnosis: ?Cervicalgia ? ?Muscle weakness (generalized) ? ? ? ? ?Problem List ?There are no problems to display for this patient. ? ?MPura Spice PT, DPT # [phone]?05/13/2021, 9:40 AM ? ?[place] ?[facility] ?[address] ?[place] [address]?Phone: [phone]  Fax:  [phone]? ?Name: VLeonia Henson?MRN: [id number]?Date of Birth: 12/28/1929/12/01? ? ? ?

## 2021-05-16 ENCOUNTER — Other Ambulatory Visit: Payer: Self-pay

## 2021-05-16 ENCOUNTER — Ambulatory Visit: Payer: Medicare Other | Admitting: Physical Therapy

## 2021-05-16 DIAGNOSIS — M6281 Muscle weakness (generalized): Secondary | ICD-10-CM

## 2021-05-16 DIAGNOSIS — M542 Cervicalgia: Secondary | ICD-10-CM | POA: Diagnosis not present

## 2021-05-19 ENCOUNTER — Other Ambulatory Visit: Payer: Self-pay

## 2021-05-19 ENCOUNTER — Ambulatory Visit: Payer: Medicare Other | Admitting: Physical Therapy

## 2021-05-19 DIAGNOSIS — M542 Cervicalgia: Secondary | ICD-10-CM

## 2021-05-19 DIAGNOSIS — M6281 Muscle weakness (generalized): Secondary | ICD-10-CM

## 2021-05-20 NOTE — Therapy (Signed)
[place] ?[facility] ?[address] [place], [place], [address] ?Phone: [phone]   Fax:  [phone] ? ?Physical Therapy Treatment ? ?Patient Details  ?Name: Bonnie Henson ?MRN: [id number] ?Date of Birth: 1929/04/14 ?Referring Provider (PT): Dr. Algie Coffer ? ? ?Encounter Date: 05/16/2021 ? ? PT End of Session - 05/20/21 1947   ? ? Visit Number 13   ? Number of Visits 18   ? Date for PT Re-Evaluation 05/31/21   ? Authorization Type eval 03/21/21   ? PT Start Time 8841   ? PT Stop Time 6606   ? PT Time Calculation (min) 50 min   ? Activity Tolerance Patient tolerated treatment well;Patient limited by pain   ? Behavior During Therapy [facility] for tasks assessed/performed   ? ?  ?  ? ?  ? ? ?Past Medical History:  ?Diagnosis Date  ? Diabetes mellitus without complication ([place])   ? Hyperlipidemia   ? Hypertension   ? Neuropathy   ? Sleep apnea   ? Thyroid disease   ? ? ?Past Surgical History:  ?Procedure Laterality Date  ? ABDOMINAL HYSTERECTOMY    ? BREAST SURGERY    ? FRACTURE SURGERY    ? TONSILLECTOMY    ? ? ?There were no vitals filed for this visit. ? ? Subjective Assessment - 05/20/21 1941   ? ? Subjective Pt. entered PT with c/o L sided neck pain.  Pt. states she can manage her neck pain while sitting in chair/ lying in bed at home.  Pt. reports no neck pain while reading at night.  Pt. reports a lot of daily stress and difficulty relaxing.  No c/o headache prior to tx. session.   ? Patient is accompained by: --   Friend  ? Pertinent History Patient has history of neck pain insidious onset in March 2021. No known MOI the pt stated that "the pain just started one day". Pt also has history of arthritis within her neck. When the pain first began the patient went to her GP who referred her to PT. PT performed STM and periscapular strengthening, but pain did not improve. [facility] which also provided no relief. Pt then went to a  neurologist who gave her another shot which helped to alleviate some of the pain. Pt received a second set of shots from the neurologist within the last few weeks. The second set of shots provided some relief. Pt also uses heat, biofreeze and OTC pain medications to help with the pain. Pain does not affect sleep. Pt reports that she does not notice the pain in the mornings, but it becomes more apparent as the day goes on and she moves her neck more frequently. Pt reports also occasionally having headaches, but they presented before the neck pain and the neck pain does not cause an increase in headaches. Pain from neck will occasionally travel up into L temple and eye, described as a "pressure" behind the eye. 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   ? Limitations Sitting;Other (comment)   Moving head to look around  ? Patient Stated Goals Decrease neck pain   ? Currently in Pain? Yes   ? Pain Score 4    ? Pain Location Neck   ? Pain Orientation Left   ? Pain Descriptors / Indicators Aching   ? Pain Onset More than a month ago   ? ?  ?  ? ?  ? ? ?  TREATMENT ?  ?Therex: ?  ?Seated B shoulder AAROM with wand/PT assist all planes (as tolerated)- limited sh. ROM due to h/o dislocations.  Mirror feedback (sh. Flexion/ chest press/ sh. Abduction). ?Supine 2# L/R shoulder press-ups/ bicep curls/ tricep extension 20x.  ?Seated cervical AROM all planes.    ?  ?Manual Therapy: ?  ?MH to L/R UT in supine during shoulder AROM reassessment.  ?Supine cervical P/A mobilizations C2-C5 grade I-II 20s/bout x 1 bout per level ?Supine upper trap stretch 3 x 30s hold bilaterally ?Supine lateral neck flexion stretch 3 x 30s bilaterally;; ?Gentle cervical traction 10s hold/10s relax x 3; ?STM to L and R upper traps and cervical paraspinals in supine and seated posture.   ? ? ? ? PT Short Term Goals - 05/04/21 0951   ? ?  ? PT SHORT TERM GOAL #1  ? Title Pt will be independent with HEP in order to improve  strength and decrease back pain in order to improve pain-free function at home and work.   ? Time 3   ? Period Weeks   ? Status Achieved   ? Target Date 05/03/21   ? ?  ?  ? ?  ? ? ? ? PT Long Term Goals - 05/04/21 0952   ? ?  ? PT LONG TERM GOAL #1  ? Title Pt will be able to sit for the entirety of a meal without neck pain.   ? Baseline neck pain varies t/o day   ? Time 4   ? Period Weeks   ? Status Partially Met   ? Target Date 05/31/21   ?  ? PT LONG TERM GOAL #2  ? Title Pt's FOTO score will increase to at least a 56 to demonstrate significant improvement in function related to her neck pain   ? Baseline 03/21/21: 47   ? Time 6   ? Period Weeks   ? Status On-going   ? Target Date 05/31/21   ?  ? PT LONG TERM GOAL #3  ? Title Pt will increase R neck rotation by at least 10 degree to be symmetrical with the L side in order to be able to turn head while sitting next to others at the dining table.   ? Baseline L rotation: 35, R rotation: 25   ? Time 6   ? Period Weeks   ? Status Not Met   ? Target Date 05/31/21   ?  ? PT LONG TERM GOAL #4  ? Title Pt will report at least 50% improvement in her neck symptoms in order to perform all of her household and community activities with less pain   ? Time 6   ? Period Weeks   ? Status Partially Met   ? Target Date 05/31/21   ? ?  ?  ? ?  ? ? ? ? ? ? ? ? Plan - 05/20/21 1947   ? ? Clinical Impression Statement Moderate L UT/levator/ suboccipital tigthness noted during supine STM/ gentle ROM stretches.  Limited L/R cervical lateral flexion and rotn. in sitting and supine position.  L neck pain increased at end of tx. after very gentle stretches/ STM.  Pt. cued for posture correction in sitting/ walking with use of rollator.  No change to HEP.   ? Personal Factors and Comorbidities Age;Time since onset of injury/illness/exacerbation;Past/Current Experience;Comorbidity 3+   ? Comorbidities Diabetes, neuropathy, OA   ? Examination-Activity Limitations  Dressing;Hygiene/Grooming;Self Feeding   ? Examination-Participation Restrictions Shop;Community  Activity   ? Stability/Clinical Decision Making Stable/Uncomplicated   ? Clinical Decision Making Low   ? Rehab Potential Fair   ? PT Frequency 2x / week   ? PT Duration 6 weeks   ? PT Treatment/Interventions Cryotherapy;Electrical Stimulation;Iontophoresis 60m/ml Dexamethasone;Moist Heat;Traction;Ultrasound;Therapeutic activities;Therapeutic exercise;Neuromuscular re-education;Manual techniques;Passive range of motion;Dry needling;Spinal Manipulations;Joint Manipulations   ? PT Next Visit Plan Add gentle strengthening ex.   ? PT Home Exercise Plan Access Code: [email or web address]  ? Consulted and Agree with Plan of Care Patient   ? ?  ?  ? ?  ? ? ?Patient will benefit from skilled therapeutic intervention in order to improve the following deficits and impairments:  Decreased strength, Hypomobility, Pain, Decreased range of motion ? ?Visit Diagnosis: ?Cervicalgia ? ?Muscle weakness (generalized) ? ? ? ? ?Problem List ?There are no problems to display for this patient. ? ?MPura Spice PT, DPT # [id number]?05/20/2021, 7:58 PM ? ?[place] ?[facility] ?[address] ?[place] [address]?Phone: [phone]  Fax:  [phone]? ?Name: VLeonia Henson?MRN: [id number]?Date of Birth: [birth year]? ? ? ?

## 2021-05-21 NOTE — Therapy (Signed)
[place] ?[facility] ?[address] [place], [place], [address] ?Phone: [phone]   Fax:  [phone] ? ?Physical Therapy Treatment ? ?Patient Details  ?Name: Bonnie Henson ?MRN: [id number] ?Date of Birth: 1929/10/31 ?Referring Provider (PT): Dr. Algie Coffer ? ? ?Encounter Date: 05/19/2021 ? ? PT End of Session - 05/21/21 1337   ? ? Visit Number 14   ? Number of Visits 18   ? Date for PT Re-Evaluation 05/31/21   ? Authorization Type eval 03/21/21   ? PT Start Time 1424   ? PT Stop Time 1829   ? PT Time Calculation (min) 47 min   ? Activity Tolerance Patient tolerated treatment well;Patient limited by pain   ? Behavior During Therapy [facility] for tasks assessed/performed   ? ?  ?  ? ?  ? ? ?Past Medical History:  ?Diagnosis Date  ? Diabetes mellitus without complication ([place])   ? Hyperlipidemia   ? Hypertension   ? Neuropathy   ? Sleep apnea   ? Thyroid disease   ? ? ?Past Surgical History:  ?Procedure Laterality Date  ? ABDOMINAL HYSTERECTOMY    ? BREAST SURGERY    ? FRACTURE SURGERY    ? TONSILLECTOMY    ? ? ?There were no vitals filed for this visit. ? ? Subjective Assessment - 05/21/21 1331   ? ? Subjective Pt. reports L sided neck while sitting in waiting room and walking back into clinic.   ? Patient is accompained by: --   Friend  ? Pertinent History Patient has history of neck pain insidious onset in March 2021. No known MOI the pt stated that "the pain just started one day". Pt also has history of arthritis within her neck. When the pain first began the patient went to her GP who referred her to PT. PT performed STM and periscapular strengthening, but pain did not improve. [facility] which also provided no relief. Pt then went to a neurologist who gave her another shot which helped to alleviate some of the pain. Pt received a second set of shots from the neurologist within the last few weeks. The second set of shots provided some  relief. Pt also uses heat, biofreeze and OTC pain medications to help with the pain. Pain does not affect sleep. Pt reports that she does not notice the pain in the mornings, but it becomes more apparent as the day goes on and she moves her neck more frequently. Pt reports also occasionally having headaches, but they presented before the neck pain and the neck pain does not cause an increase in headaches. Pain from neck will occasionally travel up into L temple and eye, described as a "pressure" behind the eye. 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   ? Limitations Sitting;Other (comment)   Moving head to look around  ? Patient Stated Goals Decrease neck pain   ? Currently in Pain? Yes   no subjective pain score given  ? Pain Location Neck   ? Pain Orientation Left   ? Pain Onset More than a month ago   ? ?  ?  ? ?  ? ? ?TREATMENT ?  ?Therex: ?  ?Seated B shoulder AAROM with wand/PT assist all planes (as tolerated)- limited sh. ROM due to h/o dislocations.  Mirror feedback (sh. Flexion/ chest press/ sh. Abduction). ?Standing B shoulder AROM (flexion/ scaption)- cuing to relax  UT/ cervical musculature.   ?Seated/ supine cervical AROM all planes.    ?  ?Manual therapy: ?  ?MH to L/R UT in seated after there.ex. ?Seated L/R UT and cervical STM ?Supine cervical P/A mobilizations C2-C5 grade I-II 20s/bout x 1 bout per level ?Supine upper trap stretch 3 x 30s hold bilaterally ?Supine lateral neck flexion stretch 3 x 30s bilaterally;; ?Gentle cervical traction 10s hold/10s relax x 3; ?STM to L and R upper traps and cervical paraspinals in supine.  PT assist to return to seated on mat table and cuing to relax UT/cervical musculature with use of rollator.   ?  ? ? ? ? PT Short Term Goals - 05/04/21 0951   ? ?  ? PT SHORT TERM GOAL #1  ? Title Pt will be independent with HEP in order to improve strength and decrease back pain in order to improve pain-free function at home and work.   ? Time 3    ? Period Weeks   ? Status Achieved   ? Target Date 05/03/21   ? ?  ?  ? ?  ? ? ? ? PT Long Term Goals - 05/04/21 0952   ? ?  ? PT LONG TERM GOAL #1  ? Title Pt will be able to sit for the entirety of a meal without neck pain.   ? Baseline neck pain varies t/o day   ? Time 4   ? Period Weeks   ? Status Partially Met   ? Target Date 05/31/21   ?  ? PT LONG TERM GOAL #2  ? Title Pt's FOTO score will increase to at least a 56 to demonstrate significant improvement in function related to her neck pain   ? Baseline 03/21/21: 47   ? Time 6   ? Period Weeks   ? Status On-going   ? Target Date 05/31/21   ?  ? PT LONG TERM GOAL #3  ? Title Pt will increase R neck rotation by at least 10 degree to be symmetrical with the L side in order to be able to turn head while sitting next to others at the dining table.   ? Baseline L rotation: 35, R rotation: 25   ? Time 6   ? Period Weeks   ? Status Not Met   ? Target Date 05/31/21   ?  ? PT LONG TERM GOAL #4  ? Title Pt will report at least 50% improvement in her neck symptoms in order to perform all of her household and community activities with less pain   ? Time 6   ? Period Weeks   ? Status Partially Met   ? Target Date 05/31/21   ? ?  ?  ? ?  ? ? ? ? ? ? ? ? Plan - 05/21/21 1338   ? ? Clinical Impression Statement Pt. has less muscle tenderness over L UT/suboccipital region today but pain still present with cervical AROM (all planes).  Pt. remains pain focused in neck during all seated/ standing B shoulder movements.  Pt. instructed to relax B shoulders/ neck musculature while walking in clinic with use of rollator.  Pt. relies heavily on B UE when setting into passenger side of vehicle.  Good B shoulder ROM with use of wand in seated posture with mirror feedbak.  No change in HEP.   ? Personal Factors and Comorbidities Age;Time since onset of injury/illness/exacerbation;Past/Current Experience;Comorbidity 3+   ? Comorbidities Diabetes, neuropathy, OA   ?  Examination-Activity  Limitations Dressing;Hygiene/Grooming;Self Feeding   ? Examination-Participation Restrictions Shop;Community Activity   ? Stability/Clinical Decision Making Stable/Uncomplicated   ? Clinical Decision Making Low   ? Rehab Potential Fair   ? PT Frequency 2x / week   ? PT Duration 6 weeks   ? PT Treatment/Interventions Cryotherapy;Electrical Stimulation;Iontophoresis 25m/ml Dexamethasone;Moist Heat;Traction;Ultrasound;Therapeutic activities;Therapeutic exercise;Neuromuscular re-education;Manual techniques;Passive range of motion;Dry needling;Spinal Manipulations;Joint Manipulations   ? PT Next Visit Plan Add gentle strengthening ex.   ? PT Home Exercise Plan Access Code: [email or web address]  ? Consulted and Agree with Plan of Care Patient   ? ?  ?  ? ?  ? ? ?Patient will benefit from skilled therapeutic intervention in order to improve the following deficits and impairments:  Decreased strength, Hypomobility, Pain, Decreased range of motion ? ?Visit Diagnosis: ?Cervicalgia ? ?Muscle weakness (generalized) ? ? ? ? ?Problem List ?There are no problems to display for this patient. ? ?MPura Spice PT, DPT # [phone]?05/21/2021, 1:53 PM ? ?[place] ?[facility] ?[address] ?[place] [address]?Phone: [phone]  Fax:  [phone]? ?Name: VLeonia Henson?MRN: [id number]?Date of Birth: [birth year]? ? ? ?

## 2021-05-23 ENCOUNTER — Encounter: Payer: Medicare Other | Admitting: Physical Therapy

## 2021-05-26 ENCOUNTER — Ambulatory Visit: Payer: Medicare Other | Admitting: Physical Therapy

## 2021-05-26 DIAGNOSIS — M542 Cervicalgia: Secondary | ICD-10-CM | POA: Diagnosis not present

## 2021-05-26 DIAGNOSIS — M6281 Muscle weakness (generalized): Secondary | ICD-10-CM

## 2021-05-28 NOTE — Therapy (Signed)
[place] ?[facility] ?[address] [place], [place], [address] ?Phone: [phone]   Fax:  [phone] ? ?Physical Therapy Treatment ? ?Patient Details  ?Name: Bonnie Henson ?MRN: [id number] ?Date of Birth: 1929/04/07 ?Referring Provider (PT): Dr. Algie Coffer ? ? ?Encounter Date: 05/26/2021 ? ? PT End of Session - 05/28/21 0747   ? ? Visit Number 15   ? Number of Visits 18   ? Date for PT Re-Evaluation 05/31/21   ? Authorization Type eval 03/21/21   ? PT Start Time 1425   ? PT Stop Time 1513   ? PT Time Calculation (min) 48 min   ? Activity Tolerance Patient tolerated treatment well;Patient limited by pain   ? Behavior During Therapy [facility] for tasks assessed/performed   ? ?  ?  ? ?  ? ? ?Past Medical History:  ?Diagnosis Date  ? Diabetes mellitus without complication ([place])   ? Hyperlipidemia   ? Hypertension   ? Neuropathy   ? Sleep apnea   ? Thyroid disease   ? ? ?Past Surgical History:  ?Procedure Laterality Date  ? ABDOMINAL HYSTERECTOMY    ? BREAST SURGERY    ? FRACTURE SURGERY    ? TONSILLECTOMY    ? ? ?There were no vitals filed for this visit. ? ? Subjective Assessment - 05/28/21 0745   ? ? Subjective Pt. reports L sided neck pain is 7-8/10 currently at rest.  Pt. states she is able to sit at home in a certain position with less neck pain.   ? Patient is accompained by: --   Friend  ? Pertinent History Patient has history of neck pain insidious onset in March 2021. No known MOI the pt stated that "the pain just started one day". Pt also has history of arthritis within her neck. When the pain first began the patient went to her GP who referred her to PT. PT performed STM and periscapular strengthening, but pain did not improve. [facility] which also provided no relief. Pt then went to a neurologist who gave her another shot which helped to alleviate some of the pain. Pt received a second set of shots from the neurologist within the  last few weeks. The second set of shots provided some relief. Pt also uses heat, biofreeze and OTC pain medications to help with the pain. Pain does not affect sleep. Pt reports that she does not notice the pain in the mornings, but it becomes more apparent as the day goes on and she moves her neck more frequently. Pt reports also occasionally having headaches, but they presented before the neck pain and the neck pain does not cause an increase in headaches. Pain from neck will occasionally travel up into L temple and eye, described as a "pressure" behind the eye. 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   ? Limitations Sitting;Other (comment)   Moving head to look around  ? Patient Stated Goals Decrease neck pain   ? Currently in Pain? Yes   ? Pain Score 8    ? Pain Location Neck   ? Pain Orientation Left   ? Pain Descriptors / Indicators Aching;Constant   ? Pain Onset More than a month ago   ? ?  ?  ? ?  ? ?Therex: ? ?Supine B shoulder AROM all planes.   ?Seated/ supine cervical AROM all planes.    ?Reviewed/discussed  HEP  ? ?Manual therapy: ? ?MH to L UT in seated after there.ex. ?Seated L/R UT and cervical STM ?Supine cervical P/A mobilizations C2-C5 grade I-II 20s/bout x 1 bout per level ?Supine upper trap stretch 3 x 30s hold bilaterally ?Supine lateral neck flexion stretch 3 x 30s bilaterally;; ?Gentle cervical traction 10s hold/10s relax x 3; ?STM to L and R upper traps and cervical paraspinals in supine.  PT assist to return to seated on mat table and cuing to relax UT/cervical musculature with use of rollator.  Hypervolt to B upper thoracic region.    ?  ? ? ? ? ? PT Short Term Goals - 05/04/21 0951   ? ?  ? PT SHORT TERM GOAL #1  ? Title Pt will be independent with HEP in order to improve strength and decrease back pain in order to improve pain-free function at home and work.   ? Time 3   ? Period Weeks   ? Status Achieved   ? Target Date 05/03/21   ? ?  ?  ? ?  ? ? ? ? PT  Long Term Goals - 05/04/21 0952   ? ?  ? PT LONG TERM GOAL #1  ? Title Pt will be able to sit for the entirety of a meal without neck pain.   ? Baseline neck pain varies t/o day   ? Time 4   ? Period Weeks   ? Status Partially Met   ? Target Date 05/31/21   ?  ? PT LONG TERM GOAL #2  ? Title Pt's FOTO score will increase to at least a 56 to demonstrate significant improvement in function related to her neck pain   ? Baseline 03/21/21: 47   ? Time 6   ? Period Weeks   ? Status On-going   ? Target Date 05/31/21   ?  ? PT LONG TERM GOAL #3  ? Title Pt will increase R neck rotation by at least 10 degree to be symmetrical with the L side in order to be able to turn head while sitting next to others at the dining table.   ? Baseline L rotation: 35, R rotation: 25   ? Time 6   ? Period Weeks   ? Status Not Met   ? Target Date 05/31/21   ?  ? PT LONG TERM GOAL #4  ? Title Pt will report at least 50% improvement in her neck symptoms in order to perform all of her household and community activities with less pain   ? Time 6   ? Period Weeks   ? Status Partially Met   ? Target Date 05/31/21   ? ?  ?  ? ?  ? ? ? ? ? ? ? ? Plan - 05/28/21 0747   ? ? Clinical Impression Statement Pt. continues to present with less muscle tenderness/ tightness over L UT/ suboccipital region in seated posture today.  Significant joint stiffness remains pain limiting factor with c-spine rotn./ lateral flexion.  PT discussed TDN to pt. and PT feels TDN will not improve pts. pain symptoms.  Pt. challenged with short distance ambulation in PT clinic without use of rollator and benefits from rollator with all aspects of walking.   ? Personal Factors and Comorbidities Age;Time since onset of injury/illness/exacerbation;Past/Current Experience;Comorbidity 3+   ? Comorbidities Diabetes, neuropathy, OA   ? Examination-Activity Limitations Dressing;Hygiene/Grooming;Self Feeding   ? Examination-Participation Restrictions Shop;Community Activity   ?  Stability/Clinical Decision Making Stable/Uncomplicated   ?  Clinical Decision Making Low   ? Rehab Potential Fair   ? PT Frequency 2x / week   ? PT Duration 6 weeks   ? PT Treatment/Interventions Cryotherapy;Electrical Stimulation;Iontophoresis 73m/ml Dexamethasone;Moist Heat;Traction;Ultrasound;Therapeutic activities;Therapeutic exercise;Neuromuscular re-education;Manual techniques;Passive range of motion;Dry needling;Spinal Manipulations;Joint Manipulations   ? PT Next Visit Plan Add gentle strengthening ex.  DISCUSS POC/ RETURN TO MD   ? PT Home Exercise Plan Access Code: [email or web address]  ? Consulted and Agree with Plan of Care Patient   ? ?  ?  ? ?  ? ? ?Patient will benefit from skilled therapeutic intervention in order to improve the following deficits and impairments:  Decreased strength, Hypomobility, Pain, Decreased range of motion ? ?Visit Diagnosis: ?Cervicalgia ? ?Muscle weakness (generalized) ? ? ? ? ?Problem List ?There are no problems to display for this patient. ? ?MPura Spice PT, DPT # [id number]?05/28/2021, 8:06 AM ? ? [facility] ?[address] ?[place] [address]?Phone: [phone]  Fax:  [phone]? ?Name: VLeonia Henson?MRN: [id number]?Date of Birth: 912/12/1929? ? ? ?

## 2021-05-30 ENCOUNTER — Ambulatory Visit: Payer: Medicare Other | Attending: Unknown Physician Specialty | Admitting: Physical Therapy

## 2021-05-30 DIAGNOSIS — M6281 Muscle weakness (generalized): Secondary | ICD-10-CM | POA: Diagnosis present

## 2021-05-30 DIAGNOSIS — M542 Cervicalgia: Secondary | ICD-10-CM | POA: Diagnosis present

## 2021-05-31 ENCOUNTER — Encounter: Payer: Self-pay | Admitting: Physical Therapy

## 2021-05-31 NOTE — Therapy (Signed)
[place] ?[facility] ?[address] [place], [place], [address] ?Phone: [phone]   Fax:  [phone] ? ?Physical Therapy Treatment/Discharge ? ?Patient Details  ?Name: Bonnie Henson ?MRN: [id number] ?Date of Birth: [birth year] ?Referring Provider (PT): Dr. Algie Coffer ? ? ?Encounter Date: 05/30/2021 ? ? PT End of Session - 05/31/21 2016   ? ? Visit Number 16   ? Number of Visits 18   ? Date for PT Re-Evaluation 05/31/21   ? Authorization Type eval 03/21/21   ? PT Start Time 1346   ? PT Stop Time 1430   ? PT Time Calculation (min) 44 min   ? Activity Tolerance Patient tolerated treatment well;Patient limited by pain   ? Behavior During Therapy [facility] for tasks assessed/performed   ? ?  ?  ? ?  ? ? ?Past Medical History:  ?Diagnosis Date  ? Diabetes mellitus without complication ([place])   ? Hyperlipidemia   ? Hypertension   ? Neuropathy   ? Sleep apnea   ? Thyroid disease   ? ? ?Past Surgical History:  ?Procedure Laterality Date  ? ABDOMINAL HYSTERECTOMY    ? BREAST SURGERY    ? FRACTURE SURGERY    ? TONSILLECTOMY    ? ? ?There were no vitals filed for this visit. ? ? Subjective Assessment - 05/31/21 2014   ? ? Subjective Pt. entered PT with similar L sided neck pain complaints with cervical movement.  Pt. has been able to manage pain symptoms with position changes while in chair/ bed at home.   ? Patient is accompained by: --   Friend  ? Pertinent History Patient has history of neck pain insidious onset in March 2021. No known MOI the pt stated that "the pain just started one day". Pt also has history of arthritis within her neck. When the pain first began the patient went to her GP who referred her to PT. PT performed STM and periscapular strengthening, but pain did not improve. [facility] which also provided no relief. Pt then went to a neurologist who gave her another shot which helped to alleviate some of the pain. Pt received a second  set of shots from the neurologist within the last few weeks. The second set of shots provided some relief. Pt also uses heat, biofreeze and OTC pain medications to help with the pain. Pain does not affect sleep. Pt reports that she does not notice the pain in the mornings, but it becomes more apparent as the day goes on and she moves her neck more frequently. Pt reports also occasionally having headaches, but they presented before the neck pain and the neck pain does not cause an increase in headaches. Pain from neck will occasionally travel up into L temple and eye, described as a "pressure" behind the eye. 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   ? Limitations Sitting;Other (comment)   Moving head to look around  ? Patient Stated Goals Decrease neck pain   ? Currently in Pain? Yes   ? Pain Score 6    ? Pain Location Neck   ? Pain Orientation Left   ? Pain Descriptors / Indicators Aching;Constant   ? Pain Onset More than a month ago   ? ?  ?  ? ?  ? ? ?Therex: ?  ?Supine B shoulder AROM all planes.   ?Seated/ supine cervical AROM all planes.    ?  Reviewed/discussed HEP  ?  ?Manual therapy: ?  ?MH to L UT in seated after there.ex. ?Seated L/R UT and cervical STM ?Supine cervical P/A mobilizations C2-C5 grade I-II 20s/bout x 1 bout per level ?Supine upper trap stretch 3 x 30s hold bilaterally ?Supine lateral neck flexion stretch 3 x 30s bilaterally;; ?Gentle cervical traction 10s hold/10s relax x 3; ?STM to L and R upper traps and cervical paraspinals in supine.  PT assist to return to seated on mat table and cuing to relax UT/cervical musculature with use of rollator.  Hypervolt to B upper thoracic region.   Applied Biofreeze to L side of neck at end of tx.   ?  ? ? ? ? ? PT Short Term Goals - 05/04/21 0951   ? ?  ? PT SHORT TERM GOAL #1  ? Title Pt will be independent with HEP in order to improve strength and decrease back pain in order to improve pain-free function at home and  work.   ? Time 3   ? Period Weeks   ? Status Achieved   ? Target Date 05/03/21   ? ?  ?  ? ?  ? ? ? ? PT Long Term Goals - 05/31/21 2020   ? ?  ? PT LONG TERM GOAL #1  ? Title Pt will be able to sit for the entirety of a meal without neck pain.   ? Baseline neck pain varies t/o day   ? Time 4   ? Period Weeks   ? Status Partially Met   ? Target Date 05/31/21   ?  ? PT LONG TERM GOAL #2  ? Title Pt's FOTO score will increase to at least a 56 to demonstrate significant improvement in function related to her neck pain   ? Baseline 03/21/21: 47.  4/3: 55   ? Time 6   ? Period Weeks   ? Status Achieved   ? Target Date 05/30/21   ?  ? PT LONG TERM GOAL #3  ? Title Pt will increase R neck rotation by at least 10 degree to be symmetrical with the L side in order to be able to turn head while sitting next to others at the dining table.   ? Baseline L rotation: 35, R rotation: 25   ? Time 6   ? Period Weeks   ? Status Not Met   ? Target Date 05/31/21   ?  ? PT LONG TERM GOAL #4  ? Title Pt will report at least 50% improvement in her neck symptoms in order to perform all of her household and community activities with less pain   ? Time 6   ? Period Weeks   ? Status Partially Met   ? Target Date 05/31/21   ? ?  ?  ? ?  ? ? ? ? ? ? ? ? Plan - 05/31/21 2017   ? ? Clinical Impression Statement Pts. progress with cervical AROM limited by significant joint stiffness/ arthritis in neck.  Pt. understands importance of posture correction/ cervical stretches.  Pt. will benefit from discharge from PT at this time with self-maintenance of symptoms.  Pt. instructed to return to MD if pain worsens and instruct PT if any questions.  See updated PT goals.  Discharge from PT at this time.   ? Personal Factors and Comorbidities Age;Time since onset of injury/illness/exacerbation;Past/Current Experience;Comorbidity 3+   ? Comorbidities Diabetes, neuropathy, OA   ? Examination-Activity Limitations Dressing;Hygiene/Grooming;Self Feeding   ?  Examination-Participation Restrictions Shop;Community Activity   ? Stability/Clinical Decision Making Stable/Uncomplicated   ? Clinical Decision Making Low   ? Rehab Potential Fair   ? PT Frequency 2x / week   ? PT Duration 6 weeks   ? PT Treatment/Interventions Cryotherapy;Electrical Stimulation;Iontophoresis 49m/ml Dexamethasone;Moist Heat;Traction;Ultrasound;Therapeutic activities;Therapeutic exercise;Neuromuscular re-education;Manual techniques;Passive range of motion;Dry needling;Spinal Manipulations;Joint Manipulations   ? PT Next Visit Plan Discharge visit.   ? PT Home Exercise Plan Access Code: [email or web address]  ? Consulted and Agree with Plan of Care Patient   ? ?  ?  ? ?  ? ? ?Patient will benefit from skilled therapeutic intervention in order to improve the following deficits and impairments:  Decreased strength, Hypomobility, Pain, Decreased range of motion ? ?Visit Diagnosis: ?Cervicalgia ? ?Muscle weakness (generalized) ? ? ? ? ?Problem List ?There are no problems to display for this patient. ? ?MPura Spice PT, DPT # [phone]?05/31/2021, 8:24 PM ? ?[place] [facility] ?[address] ?[place] [address]?Phone: [phone]  Fax:  [phone]? ?Name: VLeonia Henson?MRN: [id number]?Date of Birth: 905-Nov-1931? ? ? ?

## 2021-06-02 ENCOUNTER — Encounter: Payer: Medicare Other | Admitting: Physical Therapy

## 2021-06-06 ENCOUNTER — Encounter: Payer: Medicare Other | Admitting: Physical Therapy

## 2021-06-09 ENCOUNTER — Ambulatory Visit: Payer: Medicare Other | Admitting: Dermatology

## 2021-06-09 ENCOUNTER — Encounter: Payer: Medicare Other | Admitting: Physical Therapy

## 2021-06-13 ENCOUNTER — Encounter: Payer: Medicare Other | Admitting: Physical Therapy

## 2021-06-16 ENCOUNTER — Encounter: Payer: Medicare Other | Admitting: Physical Therapy

## 2021-06-17 ENCOUNTER — Observation Stay
Admission: EM | Admit: 2021-06-17 | Discharge: 2021-06-19 | Disposition: A | Discharge status: Home / Self Care | Payer: Medicare Other | Attending: Emergency Medicine | Admitting: Emergency Medicine

## 2021-06-17 ENCOUNTER — Emergency Department: Payer: Medicare Other

## 2021-06-17 ENCOUNTER — Other Ambulatory Visit: Payer: Self-pay

## 2021-06-17 DIAGNOSIS — G4730 Sleep apnea, unspecified: Secondary | ICD-10-CM | POA: Diagnosis present

## 2021-06-17 DIAGNOSIS — Z7982 Long term (current) use of aspirin: Secondary | ICD-10-CM | POA: Insufficient documentation

## 2021-06-17 DIAGNOSIS — M6281 Muscle weakness (generalized): Secondary | ICD-10-CM | POA: Insufficient documentation

## 2021-06-17 DIAGNOSIS — R079 Chest pain, unspecified: Secondary | ICD-10-CM | POA: Diagnosis not present

## 2021-06-17 DIAGNOSIS — E039 Hypothyroidism, unspecified: Secondary | ICD-10-CM | POA: Diagnosis not present

## 2021-06-17 DIAGNOSIS — Z79899 Other long term (current) drug therapy: Secondary | ICD-10-CM | POA: Insufficient documentation

## 2021-06-17 DIAGNOSIS — I10 Essential (primary) hypertension: Secondary | ICD-10-CM | POA: Diagnosis not present

## 2021-06-17 DIAGNOSIS — E079 Disorder of thyroid, unspecified: Secondary | ICD-10-CM | POA: Diagnosis present

## 2021-06-17 DIAGNOSIS — I2510 Atherosclerotic heart disease of native coronary artery without angina pectoris: Secondary | ICD-10-CM | POA: Diagnosis not present

## 2021-06-17 DIAGNOSIS — R001 Bradycardia, unspecified: Secondary | ICD-10-CM | POA: Diagnosis not present

## 2021-06-17 LAB — BASIC METABOLIC PANEL
Anion gap: 9 (ref 5–15)
BUN: 29 mg/dL — ABNORMAL HIGH (ref 8–23)
CO2: 30 mmol/L (ref 22–32)
Calcium: 9.2 mg/dL (ref 8.9–10.3)
Chloride: 93 mmol/L — ABNORMAL LOW (ref 98–111)
Creatinine, Ser: 1.14 mg/dL — ABNORMAL HIGH (ref 0.44–1.00)
GFR, Estimated: 46 mL/min — ABNORMAL LOW (ref >60)
Glucose, Bld: 109 mg/dL — ABNORMAL HIGH (ref 70–99)
Potassium: 4.8 mmol/L (ref 3.5–5.1)
Sodium: 132 mmol/L — ABNORMAL LOW (ref 135–145)

## 2021-06-17 LAB — CBC
HCT: 40.8 % (ref 36.0–46.0)
Hemoglobin: 12.5 g/dL (ref 12.0–15.0)
MCH: 29.5 pg (ref 26.0–34.0)
MCHC: 30.6 g/dL (ref 30.0–36.0)
MCV: 96.2 fL (ref 80.0–100.0)
Platelets: 244 10*3/uL (ref 150–400)
RBC: 4.24 MIL/uL (ref 3.87–5.11)
RDW: 13 % (ref 11.5–15.5)
WBC: 6.8 10*3/uL (ref 4.0–10.5)
nRBC: 0 % (ref 0.0–0.2)

## 2021-06-17 LAB — TSH: TSH: 0.31 u[IU]/mL — ABNORMAL LOW (ref 0.350–4.500)

## 2021-06-17 LAB — TROPONIN I (HIGH SENSITIVITY)
Troponin I (High Sensitivity): 18 ng/L — ABNORMAL HIGH (ref <18)
Troponin I (High Sensitivity): 20 ng/L — ABNORMAL HIGH (ref <18)
Troponin I (High Sensitivity): 23 ng/L — ABNORMAL HIGH (ref <18)

## 2021-06-17 MED ORDER — NITROGLYCERIN 0.4 MG SL SUBL: 0.4000 mg | SUBLINGUAL_TABLET | SUBLINGUAL | Status: DC | PRN

## 2021-06-17 MED ORDER — RAMIPRIL 10 MG PO CAPS
10.0000 mg | ORAL_CAPSULE | Freq: Every day | ORAL | Status: DC
  Administered 2021-06-18 – 2021-06-19 (×2): 10 mg via ORAL
  Filled 2021-06-17 (×2): qty 1

## 2021-06-17 MED ORDER — TRAZODONE HCL 50 MG PO TABS
50.0000 mg | ORAL_TABLET | Freq: Every day | ORAL | Status: DC
  Administered 2021-06-17 – 2021-06-18 (×2): 50 mg via ORAL
  Filled 2021-06-17 (×2): qty 1

## 2021-06-17 MED ORDER — GABAPENTIN 300 MG PO CAPS
300.0000 mg | ORAL_CAPSULE | Freq: Three times a day (TID) | ORAL | Status: DC
  Administered 2021-06-17 – 2021-06-18 (×2): 300 mg via ORAL
  Filled 2021-06-17 (×2): qty 1

## 2021-06-17 MED ORDER — ACETAMINOPHEN 325 MG PO TABS
650.0000 mg | ORAL_TABLET | ORAL | Status: DC | PRN
  Administered 2021-06-18 – 2021-06-19 (×2): 650 mg via ORAL
  Filled 2021-06-17 (×2): qty 2

## 2021-06-17 MED ORDER — ONDANSETRON HCL 4 MG/2ML IJ SOLN
4.0000 mg | Freq: Four times a day (QID) | INTRAMUSCULAR | Status: DC | PRN
Start: 2021-06-17 — End: 2021-06-19

## 2021-06-17 MED ORDER — ROSUVASTATIN CALCIUM 5 MG PO TABS
5.0000 mg | ORAL_TABLET | Freq: Every day | ORAL | Status: DC
Start: 2021-06-18 — End: 2021-06-18
  Administered 2021-06-18: 5 mg via ORAL
  Filled 2021-06-17: qty 1

## 2021-06-17 MED ORDER — AMLODIPINE BESYLATE 5 MG PO TABS
5.0000 mg | ORAL_TABLET | Freq: Every day | ORAL | Status: DC
  Administered 2021-06-17 – 2021-06-19 (×3): 5 mg via ORAL
  Filled 2021-06-17 (×3): qty 1

## 2021-06-17 MED ORDER — ENOXAPARIN SODIUM 40 MG/0.4ML IJ SOSY
40.0000 mg | PREFILLED_SYRINGE | INTRAMUSCULAR | Status: DC
  Administered 2021-06-17: 40 mg via SUBCUTANEOUS
  Filled 2021-06-17 (×2): qty 0.4

## 2021-06-17 MED ORDER — LEVOTHYROXINE SODIUM 50 MCG PO TABS
75.0000 ug | ORAL_TABLET | Freq: Every day | ORAL | Status: DC
  Administered 2021-06-18: 75 ug via ORAL
  Filled 2021-06-17: qty 1

## 2021-06-17 MED ORDER — ASPIRIN EC 81 MG PO TBEC
81.0000 mg | DELAYED_RELEASE_TABLET | Freq: Every day | ORAL | Status: DC
  Administered 2021-06-17 – 2021-06-19 (×3): 81 mg via ORAL
  Filled 2021-06-17 (×3): qty 1

## 2021-06-17 NOTE — H&P (Signed)
?History and Physical  ? ? ?Patient: Bonnie Henson [id number] DOB: 03/23/1929 ?DOA: 06/17/2021 ?DOS: the patient was seen and examined on 06/17/2021 ?PCP: Myrtie Soman, MD  ?Patient coming from: Home ? ?Chief Complaint:  ?Chief Complaint  ?Patient presents with  ? Chest Pain  ? ?HPI: [place] is a 88 y.o. female with medical history significant for CAD, hypothyroidism, hypertension, neuropathy who presents to the ER via EMS for evaluation of chest pressure which started at rest.  Chest pressure was intermittent, midsternal and was associated with nausea.  She denied having any diaphoresis, no shortness of breath, no palpitations and there was no radiation of her symptoms.  She took 3 sublingual nitroglycerin tablets with transient improvement in her symptoms.  She contacted her primary care provider who advised her to go to the ER for further evaluation. ?She complains of generalized weakness and feeling dizzy but denies having any falls or loss of consciousness. ?Chart review was done and patient had an abnormal stress nuclear study in 2020 which revealed anterior ischemia with an LVEF of 69%.  She declined cardiac catheterization at the time and is being treated medically. ?She denies having any cough, no fever, no chills, no abdominal pain, no headache, no dizziness, no lightheadedness, no leg swelling, no focal deficits or blurred vision. ?Review of Systems: As mentioned in the history of present illness. All other systems reviewed and are negative. ?Past Medical History:  ?Diagnosis Date  ? Diabetes mellitus without complication ([place])   ? Hyperlipidemia   ? Hypertension   ? Neuropathy   ? Sleep apnea   ? Thyroid disease   ? ?Past Surgical History:  ?Procedure Laterality Date  ? ABDOMINAL HYSTERECTOMY    ? BREAST SURGERY    ? FRACTURE SURGERY    ? TONSILLECTOMY    ? ?Social History:  reports that she has never smoked. She has never used smokeless tobacco. She reports that she does not drink alcohol and  does not use drugs. ? ?Allergies  ?Allergen Reactions  ? Rosuvastatin Other (See Comments)  ?  Muscle pain  ? ? ?Family History  ?Problem Relation Age of Onset  ? Stroke Mother   ? Cancer Father   ? Cancer Brother   ? ? ?Prior to Admission medications   ?Medication Sig Start Date End Date Taking? Authorizing Provider  ?amLODipine (NORVASC) 5 MG tablet Take 5 mg by mouth daily.    [provider]  ?ASCORBIC ACID PO Take by mouth.    [provider]  ?aspirin EC 81 MG tablet Take 81 mg by mouth daily.    [provider]  ?Cinnamon 500 MG TABS Take by mouth.    [provider]  ?gabapentin (NEURONTIN) 300 MG capsule Take 300 mg by mouth 3 (three) times daily.    [provider]  ?GLUCOSAMINE-CHONDROITIN DS PO Take by mouth.    [provider]  ?levothyroxine (SYNTHROID, LEVOTHROID) 75 MCG tablet Take 75 mcg by mouth daily before breakfast.    [provider]  ?nitroGLYCERIN (NITROSTAT) 0.6 MG SL tablet Place 0.6 mg under the tongue every 5 (five) minutes as needed for chest pain.    [provider]  ?ramipril (ALTACE) 10 MG capsule Take 10 mg by mouth daily.    [provider]  ?rosuvastatin (CRESTOR) 5 MG tablet Take 5 mg by mouth daily at 6 PM.    [provider]  ?traZODone (DESYREL) 50 MG tablet Take 50 mg by mouth at bedtime.  [provider]  ? ? ?Physical Exam: ?Vitals:  ? 06/17/21 1700 06/17/21 1730 06/17/21 1832 06/17/21 1835  ?BP: (!) 146/85 (!) 156/129  131/70  ?Pulse: 78 (!) 44 (!) 48 78  ?Resp: 15 (!) 25 16 14   ?Temp:      ?TempSrc:      ?SpO2: 96% (!) 89% 97% 95%  ? ?Physical Exam ?Vitals and nursing note reviewed.  ?Constitutional:   ?   Appearance: She is well-developed.  ?HENT:  ?   Head: Normocephalic and atraumatic.  ?Eyes:  ?   Pupils: Pupils are equal, round, and reactive to light.  ?Cardiovascular:  ?   Rate and Rhythm: Bradycardia present.  ?   Heart sounds: Normal heart sounds.  ?Pulmonary:  ?    Effort: Pulmonary effort is normal.  ?   Breath sounds: Normal breath sounds.  ?Abdominal:  ?   Palpations: Abdomen is soft.  ?Musculoskeletal:     ?   General: Normal range of motion.  ?   Cervical back: Normal range of motion and neck supple.  ?Skin: ?   General: Skin is warm and dry.  ?Neurological:  ?   General: No focal deficit present.  ?   Mental Status: She is alert.  ?Psychiatric:     ?   Mood and Affect: Mood normal.     ?   Behavior: Behavior normal.  ? ? ?Data Reviewed: ?Relevant notes from primary care and specialist visits, past discharge summaries as available in EHR, including Care Everywhere. ?Prior diagnostic testing as pertinent to current admission diagnoses ?Updated medications and problem lists for reconciliation ?ED course, including vitals, labs, imaging, treatment and response to treatment ?Triage notes, nursing and pharmacy notes and ED provider's notes ?Notable results as noted in HPI ?Labs reviewed.  Troponin 18 >. 20, TSH 0.310, sodium 132, potassium 4.8, chloride 93, bicarb 30, glucose 109, BUN 29, creatinine 1.14, calcium 9.2, white count 6.8, hemoglobin 12.5, hematocrit 40.8, platelet count 244 ?Twelve-lead EKG reviewed by me shows sinus bradycardia with LVH ?There are no new results to review at this time. ? ?Assessment and Plan: ?* Chest pain ?Patient with a known history of coronary artery disease who presents to the ER for evaluation of midsternal chest pressure associated with nausea. ?Symptoms were relieved with sublingual nitroglycerin ?Chart review shows she had an abnormal stress test but declined cardiac catheterization and is being managed medically ?Continue statins and aspirin ?Continue sublingual nitroglycerin ?Cycle cardiac enzymes  ?Obtain 2D echocardiogram to assess LVEF and rule out regional wall motion abnormality ?Consult cardiology ? ?Sinus bradycardia ?Noted to be bradycardic on admission and also symptomatic.  Complains of generalized weakness and dizziness but  denies any loss of consciousness or falls. ?Bradycardia most likely is related to her known history of sleep apnea ?Patient is not on any beta-blocker calcium channel blockers ?She would likely benefit from a sleep study and a CPAP upon discharge ? ?Hypertension ?Blood pressure is stable ?Continue amlodipine and ramipril ? ?Thyroid disease ?Stable ?Continue levothyroxine ? ?Sleep apnea ?Treatment as outlined in 2 ? ? ? ? ? Advance Care Planning:   Code Status: DNR  ? ?Consults: Cardiology ? ?Family Communication: Greater than 50% of time was spent discussing patient's condition and plan of care with her at the bedside.  All questions and concerns have been addressed.  She verbalizes understanding and agrees with the plan.  CODE STATUS was discussed and she requests to be placed on a DNR status ? ?Severity of  Illness: ?The appropriate patient status for this patient is OBSERVATION. Observation status is judged to be reasonable and necessary in order to provide the required intensity of service to ensure the patient's safety. The patient's presenting symptoms, physical exam findings, and initial radiographic and laboratory data in the context of their medical condition is felt to place them at decreased risk for further clinical deterioration. Furthermore, it is anticipated that the patient will be medically stable for discharge from the hospital within 2 midnights of admission.  ? ?Author: ?Collier Bullock, MD ?06/17/2021 7:50 PM ? ?For on call review [email or web address].  ?

## 2021-06-17 NOTE — Assessment & Plan Note (Signed)
Stable. Continue levothyroxine.

## 2021-06-17 NOTE — ED Notes (Signed)
RN to bedside to introduce self to pt.  

## 2021-06-17 NOTE — ED Notes (Signed)
Patient resting comfortably on stretcher. RR even and unlabored. Offered to toilet patient but she states she is fine. Patient verbalizes no needs or complaints at this time. Patient updated on plan of care and wait time. ?

## 2021-06-17 NOTE — Plan of Care (Signed)

## 2021-06-17 NOTE — Assessment & Plan Note (Signed)
Treatment as outlined in 2 

## 2021-06-17 NOTE — ED Triage Notes (Signed)
Pt comes into the ED via EMS from independent living at [facility], with c/o chest pressure since last night, 2 nitro today and ASA 324mg  at home. Pt is in NAD on arrival ? ?155/60 ?HR52 ? ?CBG125 ?98%RA ?

## 2021-06-17 NOTE — Assessment & Plan Note (Signed)
Patient with a known history of coronary artery disease who presents to the ER for evaluation of midsternal chest pressure associated with nausea. ?Symptoms were relieved with sublingual nitroglycerin ?Chart review shows she had an abnormal stress test but declined cardiac catheterization and is being managed medically ?Continue statins and aspirin ?Continue sublingual nitroglycerin ?Cycle cardiac enzymes  ?Obtain 2D echocardiogram to assess LVEF and rule out regional wall motion abnormality ?Consult cardiology ?

## 2021-06-17 NOTE — Assessment & Plan Note (Signed)
Blood pressure is stable ?Continue amlodipine and ramipril ?

## 2021-06-17 NOTE — ED Notes (Signed)
Assisted patient to the bedside commode. Upon return to her stretcher, vital signs were updated along with temperature and pain scale in preparation for admission. ?

## 2021-06-17 NOTE — Assessment & Plan Note (Signed)
Noted to be bradycardic on admission and also symptomatic.  Complains of generalized weakness and dizziness but denies any loss of consciousness or falls. ?Bradycardia most likely is related to her known history of sleep apnea ?Patient is not on any beta-blocker calcium channel blockers ?She would likely benefit from a sleep study and a CPAP upon discharge ?

## 2021-06-17 NOTE — ED Provider Notes (Signed)
? ? ?[facility] ?Emergency Department Provider Note ? ? ? ? Event Date/Time  ? First MD Initiated Contact with Patient 06/17/21 1458   ?  (approximate) ? ? ?History  ? ?Chest Pain ? ? ?HPI ? ?Bonnie Henson is a 88 y.o. female with a history of hypertension, diabetes, bradycardia, peripheral neuropathy, thyroid disease, hyperlipidemia, decreased hearing, and sleep apnea, presents to the ED from her assisted living facility.  Patient reports chest pressure with onset last night.  She presents to the ED via EMS from the facility, having had 3 nitro tabs throughout the morning, and a 325 aspirin prior to arrival. She describes chest tightness/heaviness without pattern or radiation. She reports some mild lightheadedness and dizziness with changing position, but denies syncope. She denies diaphoresis, nausea, vomiting, or abdominal pain.  Chart review reveals that the patient may contact with her PCPs office today, and was advised to report to the ED for further evaluation.  She also had an outpatient ultrasound performed for some right lower extremity edema yesterday. ? ? ?Physical Exam  ? ?Triage Vital Signs: ?ED Triage Vitals [06/17/21 1329]  ?Enc Vitals Group  ?   BP (!) 163/94  ?   Pulse Rate (!) 53  ?   Resp 17  ?   Temp 98 ?F (36.7 ?C)  ?   Temp Source Oral  ?   SpO2 97 %  ?   Weight   ?   Height   ?   Head Circumference   ?   Peak Flow   ?   Pain Score   ?   Pain Loc   ?   Pain Edu?   ?   Excl. in GC?   ? ? ?Most recent vital signs: ?Vitals:  ? 06/17/21 1832 06/17/21 1835  ?BP:  131/70  ?Pulse: (!) 48 78  ?Resp: 16 14  ?Temp:    ?SpO2: 97% 95%  ? ? ?General Awake, no distress.  ?HEENT NCAT. PERRL. EOMI. No rhinorrhea. Mucous membranes are moist.  ?CV:  Good peripheral perfusion. Variable sinus rate ?RESP:  Normal effort. CTA ?ABD:  No distention.  ? ?ED Results / Procedures / Treatments  ? ?Labs ?(all labs ordered are listed, but only abnormal results are displayed) ?Labs Reviewed  ?BASIC  METABOLIC PANEL - Abnormal; Notable for the following components:  ?    Result Value  ? Sodium 132 (*)   ? Chloride 93 (*)   ? Glucose, Bld 109 (*)   ? BUN 29 (*)   ? Creatinine, Ser 1.14 (*)   ? GFR, Estimated 46 (*)   ? All other components within normal limits  ?TSH - Abnormal; Notable for the following components:  ? TSH 0.310 (*)   ? All other components within normal limits  ?TROPONIN I (HIGH SENSITIVITY) - Abnormal; Notable for the following components:  ? Troponin I (High Sensitivity) 18 (*)   ? All other components within normal limits  ?TROPONIN I (HIGH SENSITIVITY) - Abnormal; Notable for the following components:  ? Troponin I (High Sensitivity) 20 (*)   ? All other components within normal limits  ?CBC  ?LIPID PANEL  ? ? ? ?EKG ? ?Vent. rate 52 BPM ?PR interval 188 ms ?QRS duration 132 ms ?QT/QTcB 490/455 ms ?P-R-T axes 62 -26 74 ?Sinus bradycardia ?Left ventricular hypertrophy with QRS widening and repolarization abnormality (R in aVL, Cornell product ) ? ?EKG is essentially unchanged based on the most recent Care[facility]where EKG report.  Images not available for review. ? ?RADIOLOGY ? ?I personally viewed and evaluated these images as part of my medical decision making, as well as reviewing the written report by the radiologist. ? ?ED Provider Interpretation: no acute findings} ? ?DG Chest 2 View ? ?Result Date: 06/17/2021 ?CLINICAL DATA:  Chest pain. EXAM: CHEST - 2 VIEW COMPARISON:  None. FINDINGS: No consolidation. No visible pleural effusions or pneumothorax. Mild enlargement the cardiac silhouette. Tortuous aorta. No acute fracture. IMPRESSION: 1. No evidence of acute cardiopulmonary disease. 2. Mild cardiomegaly. Electronically Signed   By: Feliberto HartsFrederick S Jones M.D.   On: 06/17/2021 14:26   ? ? ?PROCEDURES: ? ?Critical Care performed: No ? ?Procedures ?Orthostatic VS for the past 24 hrs: ? BP- Sitting Pulse- Sitting  ?06/17/21 1620 168/73 56  ? ? ? ? ? ?MEDICATIONS ORDERED IN ED: ?Medications   ?aspirin EC tablet 81 mg (has no administration in time range)  ?amLODipine (NORVASC) tablet 5 mg (has no administration in time range)  ?nitroGLYCERIN (NITROSTAT) SL tablet 0.4 mg (has no administration in time range)  ?ramipril (ALTACE) capsule 10 mg (has no administration in time range)  ?rosuvastatin (CRESTOR) tablet 5 mg (has no administration in time range)  ?traZODone (DESYREL) tablet 50 mg (has no administration in time range)  ?levothyroxine (SYNTHROID) tablet 75 mcg (has no administration in time range)  ?gabapentin (NEURONTIN) capsule 300 mg (has no administration in time range)  ?acetaminophen (TYLENOL) tablet 650 mg (has no administration in time range)  ?ondansetron (ZOFRAN) injection 4 mg (has no administration in time range)  ?enoxaparin (LOVENOX) injection 40 mg (has no administration in time range)  ? ? ? ?IMPRESSION / MDM / ASSESSMENT AND PLAN / ED COURSE  ?I reviewed the triage vital signs and the nursing notes. ?             ?               ? ?Differential diagnosis includes, but is not limited to, ACS, aortic dissection, pulmonary embolism, cardiac tamponade, pneumothorax, pneumonia, pericarditis, myocarditis, GI-related causes including esophagitis/gastritis, and musculoskeletal chest wall pain.   ? ?The patient is on the cardiac monitor to evaluate for evidence of arrhythmia but she did exhibit significant heart rate changes, rates from 43-80 bpm ? ?Geriatric patient to the ED for evaluation of chest pain and pressure as well as some intermittent dizziness with positional changes.  Patient presents to the ED for her complaints.  She is evaluated in the ED, found to have a work-up that does indicate some symptomatic bradycardic symptoms.  Patient's heart rate varied throughout the course of the ED in the low 40s to the mid 80s.  Patient blood pressures remain robust without signs of hypertension.  Patient's other labs are reassuring with exception of her elevated troponin which is baseline  for her at 18-20. Her TSH was subtherapeutic at 0.310. Patient's diagnosis is consistent with chest pain and symptomatic bradycardia.  Patient not on any medications that would influence her heart rate including beta-blockers or CCBs. Patient will be admitted to the hospital service for further evaluation and management of her symptoms.  Patient is agreeable to the plan at this time.  Dr. Joylene IgoAgbata will evaluate the patient in the ED and consult cardiology as appropriate. ? ? ?FINAL CLINICAL IMPRESSION(S) / ED DIAGNOSES  ? ?Final diagnoses:  ?Symptomatic bradycardia  ?Chest pain, unspecified type  ? ? ? ?Rx / DC Orders  ? ?ED Discharge Orders   ? ? None  ? ?  ? ? ? ?  Note:  This document was prepared using Dragon voice recognition software and may include unintentional dictation errors. ? ?  ?Lissa Hoard, PA-C ?06/17/21 1945 ? ?  ?Sharman Cheek, MD ?06/17/21 2357 ? ?

## 2021-06-17 NOTE — ED Notes (Signed)
Patient resting comfortably on stretcher. RR even and unlabored. Patient verbalizes no c/o chest pain or pressure, she denies any other complaints or needs at this time. Patient does ask for a sleeping pill but no admission orders have been received as of this time.  ?

## 2021-06-17 NOTE — ED Notes (Signed)
Pt ambulated to toilet and back to bed,.  ?

## 2021-06-18 ENCOUNTER — Observation Stay
Admit: 2021-06-18 | Discharge: 2021-06-18 | Disposition: A | Discharge status: Home / Self Care | Payer: Medicare Other | Attending: Internal Medicine | Admitting: Internal Medicine

## 2021-06-18 DIAGNOSIS — R079 Chest pain, unspecified: Secondary | ICD-10-CM | POA: Diagnosis not present

## 2021-06-18 LAB — ECHOCARDIOGRAM COMPLETE
AR max vel: 2.49 cm2
AV Peak grad: 7.7 mmHg
Ao pk vel: 1.39 m/s
Area-P 1/2: 1.83 cm2
Calc EF: 61.4 %
Height: 63 in
P 1/2 time: 588 msec
S' Lateral: 3.05 cm
Single Plane A2C EF: 67.2 %
Single Plane A4C EF: 56.2 %
Weight: 2832 oz

## 2021-06-18 LAB — LIPID PANEL
Cholesterol: 121 mg/dL (ref 0–200)
HDL: 55 mg/dL (ref >40)
LDL Cholesterol: 57 mg/dL (ref 0–99)
Total CHOL/HDL Ratio: 2.2 RATIO
Triglycerides: 46 mg/dL (ref <150)
VLDL: 9 mg/dL (ref 0–40)

## 2021-06-18 LAB — TROPONIN I (HIGH SENSITIVITY): Troponin I (High Sensitivity): 22 ng/L — ABNORMAL HIGH (ref <18)

## 2021-06-18 MED ORDER — HYDRALAZINE HCL 25 MG PO TABS
25.0000 mg | ORAL_TABLET | Freq: Two times a day (BID) | ORAL | Status: DC
  Administered 2021-06-18: 25 mg via ORAL
  Filled 2021-06-18: qty 1

## 2021-06-18 MED ORDER — ISOSORBIDE MONONITRATE ER 60 MG PO TB24
60.0000 mg | ORAL_TABLET | Freq: Every day | ORAL | Status: DC
  Administered 2021-06-18: 60 mg via ORAL
  Filled 2021-06-18: qty 1

## 2021-06-18 MED ORDER — ISOSORBIDE MONONITRATE ER 60 MG PO TB24
90.0000 mg | ORAL_TABLET | Freq: Every day | ORAL | Status: DC
  Administered 2021-06-19: 90 mg via ORAL
  Filled 2021-06-18: qty 1

## 2021-06-18 MED ORDER — LEVOTHYROXINE SODIUM 88 MCG PO TABS
88.0000 ug | ORAL_TABLET | Freq: Every morning | ORAL | Status: DC
  Administered 2021-06-19: 88 ug via ORAL
  Filled 2021-06-18: qty 1

## 2021-06-18 MED ORDER — ATORVASTATIN CALCIUM 20 MG PO TABS
20.0000 mg | ORAL_TABLET | Freq: Every day | ORAL | Status: DC
  Administered 2021-06-18 – 2021-06-19 (×2): 20 mg via ORAL
  Filled 2021-06-18 (×2): qty 1

## 2021-06-18 MED ORDER — SENNOSIDES-DOCUSATE SODIUM 8.6-50 MG PO TABS
1.0000 | ORAL_TABLET | Freq: Two times a day (BID) | ORAL | Status: DC
  Administered 2021-06-18 – 2021-06-19 (×2): 1 via ORAL
  Filled 2021-06-18 (×2): qty 1

## 2021-06-18 MED ORDER — HYDRALAZINE HCL 25 MG PO TABS
25.0000 mg | ORAL_TABLET | Freq: Three times a day (TID) | ORAL | Status: DC
  Administered 2021-06-18 – 2021-06-19 (×3): 25 mg via ORAL
  Filled 2021-06-18 (×4): qty 1

## 2021-06-18 MED ORDER — BISACODYL 10 MG RE SUPP: 10.0000 mg | Freq: Every day | RECTAL | Status: DC | PRN

## 2021-06-18 MED ORDER — ESCITALOPRAM OXALATE 10 MG PO TABS
20.0000 mg | ORAL_TABLET | Freq: Every day | ORAL | Status: DC
  Administered 2021-06-18 – 2021-06-19 (×2): 20 mg via ORAL
  Filled 2021-06-18 (×2): qty 2

## 2021-06-18 MED ORDER — SPIRONOLACTONE 25 MG PO TABS
25.0000 mg | ORAL_TABLET | Freq: Every day | ORAL | Status: DC
  Administered 2021-06-18 – 2021-06-19 (×2): 25 mg via ORAL
  Filled 2021-06-18 (×2): qty 1

## 2021-06-18 MED ORDER — POLYETHYLENE GLYCOL 3350 17 G PO PACK
17.0000 g | PACK | Freq: Every day | ORAL | Status: DC | PRN
  Administered 2021-06-18: 17 g via ORAL
  Filled 2021-06-18: qty 1

## 2021-06-18 MED ORDER — GABAPENTIN 300 MG PO CAPS
300.0000 mg | ORAL_CAPSULE | Freq: Two times a day (BID) | ORAL | Status: DC
  Administered 2021-06-18 – 2021-06-19 (×3): 300 mg via ORAL
  Filled 2021-06-18 (×3): qty 1

## 2021-06-18 NOTE — TOC Initial Note (Signed)
Transition of Care (TOC) - Initial/Assessment Note  ? ? ?Patient Details  ?Name: Bonnie Henson ?MRN: [id number] ?Date of Birth: [birth year] ? ?Transition of Care (TOC) CM/SW Contact:    ?Gildardo Griffes, LCSW ?Phone Number: ?06/18/2021, 8:59 AM ? ?Clinical Narrative:                 ? ?Patient presented to the ED from [facility] with chest tightness. Patient recently discharged from outpatient PT at [facility] on 4/3. PCP is Dr. Theadore Nan.  ? ?TOC will follow for discharge needs.  ? ? ?Expected Discharge Plan:  (Independent Living) ?Barriers to Discharge: Continued Medical Work up ? ? ?Patient Goals and CMS Choice ?Patient states their goals for this hospitalization and ongoing recovery are:: to go home ?CMS Medicare.gov Compare Post Acute Care list provided to:: Patient ?  ? ?Expected Discharge Plan and Services ?Expected Discharge Plan:  (Independent Living) ?  ?  ?  ?Living arrangements for the past 2 months: Independent Living Facility ?                ?  ?  ?  ?  ?  ?  ?  ?  ?  ?  ? ?Prior Living Arrangements/Services ?Living arrangements for the past 2 months: Independent Living Facility ?Lives with:: Self ?  ?       ?  ?  ?  ?  ? ?Activities of Daily Living ?Home Assistive Devices/Equipment: Jeannette How (specify type), Cane (specify quad or straight) ?ADL Screening (condition at time of admission) ?Patient's cognitive ability adequate to safely complete daily activities?: Yes ?Is the patient deaf or have difficulty hearing?: Yes ?Does the patient have difficulty seeing, even when wearing glasses/contacts?: No ?Does the patient have difficulty concentrating, remembering, or making decisions?: No ?Patient able to express need for assistance with ADLs?: No ?Does the patient have difficulty dressing or bathing?: No ?Independently performs ADLs?: Yes (appropriate for developmental age) ?Does the patient have difficulty walking or climbing stairs?: No ?Weakness of Legs: None ?Weakness of  Arms/Hands: None ? ?Permission Sought/Granted ?  ?  ?   ?   ?   ?   ? ?Emotional Assessment ?  ?  ?  ?Orientation: : Oriented to Self, Oriented to Place, Oriented to  Time, Oriented to Situation ?Alcohol / Substance Use: Not Applicable ?Psych Involvement: No (comment) ? ?Admission diagnosis:  Symptomatic bradycardia [R00.1] ?Chest pain [R07.9] ?Chest pain, unspecified type [R07.9] ?Patient Active Problem List  ? Diagnosis Date Noted  ? Chest pain 06/17/2021  ? Thyroid disease   ? Hypertension   ? Sleep apnea   ? Sinus bradycardia   ? ?PCP:  Filbert Berthold, MD ?Pharmacy:   ?[id number] - [place] ([place]), [place] - [address] ?[address] ?[place] [address] ?Phone: [phone] Fax: [phone] ? ? ? ? ?Social Determinants of Health (SDOH) Interventions ?  ? ?Readmission Risk Interventions ?   ? View : No data to display.  ?  ?  ?  ? ? ? ?

## 2021-06-18 NOTE — Progress Notes (Signed)
?PROGRESS NOTE ? ? ? ?Bonnie Henson  DTO:[id number] DOB: [birth year] DOA: 06/17/2021 ?PCP: Filbert Berthold, MD  ? ? ?Brief Narrative:  ?88 y.o. female with medical history significant for CAD, hypothyroidism, hypertension, neuropathy who presents to the ER via EMS for evaluation of chest pressure which started at rest.  Chest pressure was intermittent, midsternal and was associated with nausea.  She denied having any diaphoresis, no shortness of breath, no palpitations and there was no radiation of her symptoms.  She took 3 sublingual nitroglycerin tablets with transient improvement in her symptoms.  She contacted her primary care provider who advised her to go to the ER for further evaluation. ?She complains of generalized weakness and feeling dizzy but denies having any falls or loss of consciousness. ?Chart review was done and patient had an abnormal stress nuclear study in 2020 which revealed anterior ischemia with an LVEF of 69%.  She declined cardiac catheterization at the time and is being treated medically. ? ?On my evaluation patient is resting comfortably in bed.  She has no visible distress.  She continues to endorse occasional substernal chest pressure.  No aggravating or alleviating factors.  Patient did respond to nitroglycerin with improvement in her chest pain. ? ?Cardiology consulted on admission, consultation pending.  Echocardiogram ordered and pending ? ? ?Assessment & Plan: ?  ?Principal Problem: ?  Chest pain ?Active Problems: ?  Sinus bradycardia ?  Thyroid disease ?  Hypertension ?  Sleep apnea ? ?Chest pain ?Patient with known history of CAD ?Chest pain with typical features, midsternal, pressure in nature ?Symptoms relieved with sublingual nitroglycerin ?History of abnormal stress test ?Has declined cardiac catheterization in the past ?Troponin flat with no significant delta ?Plan: ?Follow-up TTE ?Continue statins and aspirin ?Long-acting nitrates ?Sublingual nitroglycerin as needed ?Telemetry  monitoring ? ?Symptomatic bradycardia ?Noted to be bradycardic on admission ?Associated with symptoms of generalized weakness and dizziness ?No falls or loss of consciousness ?Possibly related to underlying sleep apnea ?No AV nodal blocking agents on MAR ?Plan: ?Cardiology follow-up ?Hold any AV nodal blocking agents ?Therapy evaluations ?Recommend outpatient valuation for sleep study on discharge ? ?Essential hypertension ?Blood pressure controlled ?Continue PTA amlodipine and ramipril ?Avoid hypotension ? ?Hypothyroidism ?PTA Synthroid ? ?Sleep apnea ?Not on home CPAP ?Recommend outpatient evaluation for polysomnography ? ? ?DVT prophylaxis: SQ Lovenox ?Code Status: DNR ?Family Communication: Left VM for granddaughter Leotis Shames [phone] on 4/22 ?Disposition Plan: Status is: Observation ?The patient will require care spanning > 2 midnights and should be moved to inpatient because: Chest pain associated with symptomatic bradycardia in a patient with known CAD and abnormal stress test.  Pending cardiology consultation.  Pending therapy evaluations ? ? ?Level of care: Progressive ? ?Consultants:  ?Cardiology-[facility] ? ?Procedures:  ?None ? ?Antimicrobials: ?None  ? ? ?Subjective: ?Seen and examined.  Continues to endorse intermittent chest pain.  Appears well.  Answers all questions appropriately. ? ?Objective: ?Vitals:  ? 06/17/21 2349 06/18/21 0423 06/18/21 0751 06/18/21 1139  ?BP: 137/63 (!) 163/70 115/62 (!) 169/70  ?Pulse: 76 (!) 48 (!) 48 (!) 46  ?Resp: 17 19 16 17   ?Temp: 97.9 ?F (36.6 ?C) 98.2 ?F (36.8 ?C) 97.8 ?F (36.6 ?C) 97.9 ?F (36.6 ?C)  ?TempSrc: Oral     ?SpO2: 93% 93% 95% 97%  ?Weight:  80.3 kg    ?Height:      ? ? ?Intake/Output Summary (Last 24 hours) at 06/18/2021 1337 ?Last data filed at 06/18/2021 06/20/2021 ?Gross per 24 hour  ?Intake 600  ml  ?Output --  ?Net 600 ml  ? ?Filed Weights  ? 06/17/21 2025 06/17/21 2154 06/18/21 0423  ?Weight: 84.2 kg 80.6 kg 80.3 kg  ? ? ?Examination: ? ?General  exam: Appears calm and comfortable  ?Respiratory system: Mild bibasilar crackles.  Normal work of breathing.  Room air ?Cardiovascular system: S1-S2, bradycardic, no pedal edema ?Gastrointestinal system: Abdomen is nondistended, soft and nontender. No organomegaly or masses felt. Normal bowel sounds heard. ?Central nervous system: Alert and oriented. No focal neurological deficits. ?Extremities: Symmetric 5 x 5 power. ?Skin: No rashes, lesions or ulcers ?Psychiatry: Judgement and insight appear normal. Mood & affect appropriate.  ? ? ? ?Data Reviewed: I have personally reviewed following labs and imaging studies ? ?CBC: ?Recent Labs  ?Lab 06/17/21 ?1330  ?WBC 6.8  ?HGB 12.5  ?HCT 40.8  ?MCV 96.2  ?PLT 244  ? ?Basic Metabolic Panel: ?Recent Labs  ?Lab 06/17/21 ?1330  ?NA 132*  ?K 4.8  ?CL 93*  ?CO2 30  ?GLUCOSE 109*  ?BUN 29*  ?CREATININE 1.14*  ?CALCIUM 9.2  ? ?GFR: ?Estimated Creatinine Clearance: 33.6 mL/min (A) (by C-G formula based on SCr of 1.14 mg/dL (H)). ?Liver Function Tests: ?No results for input(s): AST, ALT, ALKPHOS, BILITOT, PROT, ALBUMIN in the last 168 hours. ?No results for input(s): LIPASE, AMYLASE in the last 168 hours. ?No results for input(s): AMMONIA in the last 168 hours. ?Coagulation Profile: ?No results for input(s): INR, PROTIME in the last 168 hours. ?Cardiac Enzymes: ?No results for input(s): CKTOTAL, CKMB, CKMBINDEX, TROPONINI in the last 168 hours. ?BNP (last 3 results) ?No results for input(s): PROBNP in the last 8760 hours. ?HbA1C: ?No results for input(s): HGBA1C in the last 72 hours. ?CBG: ?No results for input(s): GLUCAP in the last 168 hours. ?Lipid Profile: ?Recent Labs  ?  06/18/21 ?0106  ?CHOL 121  ?HDL 55  ?LDLCALC 57  ?TRIG 46  ?CHOLHDL 2.2  ? ?Thyroid Function Tests: ?Recent Labs  ?  06/17/21 ?1605  ?TSH 0.310*  ? ?Anemia Panel: ?No results for input(s): VITAMINB12, FOLATE, FERRITIN, TIBC, IRON, RETICCTPCT in the last 72 hours. ?Sepsis Labs: ?No results for input(s):  PROCALCITON, LATICACIDVEN in the last 168 hours. ? ?No results found for this or any previous visit (from the past 240 hour(s)).  ? ? ? ? ? ?Radiology Studies: ?DG Chest 2 View ? ?Result Date: 06/17/2021 ?CLINICAL DATA:  Chest pain. EXAM: CHEST - 2 VIEW COMPARISON:  None. FINDINGS: No consolidation. No visible pleural effusions or pneumothorax. Mild enlargement the cardiac silhouette. Tortuous aorta. No acute fracture. IMPRESSION: 1. No evidence of acute cardiopulmonary disease. 2. Mild cardiomegaly. Electronically Signed   By: Feliberto HartsFrederick S Jones M.D.   On: 06/17/2021 14:26  ? ?ECHOCARDIOGRAM COMPLETE ? ?Result Date: 06/18/2021 ?   ECHOCARDIOGRAM REPORT   Patient Name:   Bonnie IhaVIRGINIA W Henson Date of Exam: 06/18/2021 Medical Rec #:  [id number]       Height:       63.0 in Accession #:    [id number]      Weight:       177.0 lb Date of Birth:  [birth year]       BSA:          1.836 m? Patient Age:    89 years        BP:           163/70 mmHg Patient Gender: F               HR:  50 bpm. Exam Location:  [facility] Procedure: 2D Echo Indications:     Chest Pain R07.9  History:         Patient has no prior history of Echocardiogram examinations.  Sonographer:     Overton Mam RDCS Referring Phys:  [id number] AGBATA Diagnosing Phys: Alwyn Pea MD IMPRESSIONS  1. Left ventricular ejection fraction, by estimation, is 55 to 60%. The left ventricle has normal function. The left ventricle has no regional wall motion abnormalities. Left ventricular diastolic parameters are consistent with Grade I diastolic dysfunction (impaired relaxation).  2. Right ventricular systolic function is normal. The right ventricular size is normal.  3. The mitral valve is normal in structure. Trivial mitral valve regurgitation.  4. The aortic valve is normal in structure. Aortic valve regurgitation is mild. FINDINGS  Left Ventricle: Left ventricular ejection fraction, by estimation, is 55 to 60%. The left ventricle has normal function. The left  ventricle has no regional wall motion abnormalities. The left ventricular internal cavity size was normal in size. There is  no left ventricular hypertrophy. Left ventricular diastolic parameters are consistent w

## 2021-06-18 NOTE — Progress Notes (Signed)
Mobility Specialist - Progress Note ? ? ? 06/18/21 1300  ?Mobility  ?Activity Ambulated independently in hallway;Stood at bedside;Dangled on edge of bed  ?Level of Assistance Standby assist, set-up cues, supervision of patient - no hands on  ?Assistive Device Front wheel walker  ?Distance Ambulated (ft) 90 ft  ?Activity Response Tolerated well  ?$Mobility charge 1 Mobility  ? ? ?Pre-mobility: 72 HR, 94% SpO2 ?During mobility:86 HR,  92-95% SpO2 ? ?Pt supine upon arrival using RA and family at bedside. Pt completes bed mobility indep and STS with MinA. Pt states she can walk without RW, but would need HHA. Author encouraged use of RW for gait. Ambulated 42ft SUPERVISION voicing no complaints. Returns EOB with needs in reach and family at bedside. ? ?Clarisa Schools ?Mobility Specialist ?06/18/21, 2:03 PM ? ? ? ? ?

## 2021-06-18 NOTE — Consult Note (Signed)
?CARDIOLOGY CONSULT NOTE  ? ?   ?    ?  ? ? ? ?Patient ID: ?Bonnie Henson ?MRN: [id number] ?DOB/AGE: 1929-10-28 88 y.o. ? ?Admit date: 06/17/2021 ?Referring Physician Dr. Lucile Shuttersochukwu Agbata hospitalist ?Primary Physician Dr. Filbert BertholdJane Satter ?Primary Cardiologist Dr. Emilio MathMichael Sketch cardiologist ?Reason for Consultation chest pain ? ?HPI: Patient is a 88 year old history of coronary disease hypothyroidism hypertension neuropathy came to the ER with chest pressure tightness heaviness patient reportedly had a functional study few years ago which showed evidence of possible ischemia she was recommended to have a cardiac catheter patient refused.  Patient took sublingual nitroglycerin before coming after contacting her primary physician and advised to go to the emergency room.  Patient appears of had recurrent symptoms since refusing to have the cardiac cath there is been no significant EKG changes or elevation in troponins and now is pain-free resting comfortably does complain of generalized weakness ? ?Review of systems complete and found to be negative unless listed above  ? ? ? ?Past Medical History:  ?Diagnosis Date  ? Diabetes mellitus without complication (HCC)   ? Hyperlipidemia   ? Hypertension   ? Neuropathy   ? Sleep apnea   ? Thyroid disease   ?  ?Past Surgical History:  ?Procedure Laterality Date  ? ABDOMINAL HYSTERECTOMY    ? BREAST SURGERY    ? FRACTURE SURGERY    ? TONSILLECTOMY    ?  ?Medications Prior to Admission  ?Medication Sig Dispense Refill Last Dose  ? amLODipine (NORVASC) 5 MG tablet Take 5 mg by mouth daily.   06/17/2021  ? aspirin EC 81 MG tablet Take 81 mg by mouth daily.   06/17/2021  ? atorvastatin (LIPITOR) 20 MG tablet Take 20 mg by mouth daily.   06/17/2021  ? escitalopram (LEXAPRO) 20 MG tablet Take 20 mg by mouth daily.   06/17/2021  ? EUTHYROX 88 MCG tablet Take 88 mcg by mouth every morning.   06/17/2021  ? furosemide (LASIX) 20 MG tablet Take 1 tablet by mouth daily as needed.   06/17/2021  ?  gabapentin (NEURONTIN) 300 MG capsule Take 300-600 mg by mouth 2 (two) times daily. 300 mg qam ?600 mg qpm   06/17/2021  ? Glucosamine-Chondroit-Vit C-Mn (GLUCOSAMINE 1500 COMPLEX PO) Take 1 tablet by mouth daily.   06/17/2021  ? hydrALAZINE (APRESOLINE) 25 MG tablet Take 25 mg by mouth 2 (two) times daily.   06/17/2021  ? isosorbide mononitrate (IMDUR) 60 MG 24 hr tablet Take 60 mg by mouth daily.   06/17/2021  ? ramipril (ALTACE) 10 MG capsule Take 10 mg by mouth daily.   06/17/2021  ? spironolactone (ALDACTONE) 25 MG tablet Take 25 mg by mouth daily.   06/17/2021  ? nitroGLYCERIN (NITROSTAT) 0.6 MG SL tablet Place 0.6 mg under the tongue every 5 (five) minutes as needed for chest pain.     ? ?Social History  ? ?Socioeconomic History  ? Marital status: Married  ?  Spouse name: Not on file  ? Number of children: Not on file  ? Years of education: Not on file  ? Highest education level: Not on file  ?Occupational History  ? Not on file  ?Tobacco Use  ? Smoking status: Never  ? Smokeless tobacco: Never  ?Vaping Use  ? Vaping Use: Never used  ?Substance and Sexual Activity  ? Alcohol use: No  ? Drug use: No  ? Sexual activity: Not on file  ?Other Topics Concern  ? Not on file  ?  Social History Narrative  ? Not on file  ? ?Social Determinants of Health  ? ?Financial Resource Strain: Not on file  ?Food Insecurity: Not on file  ?Transportation Needs: Not on file  ?Physical Activity: Not on file  ?Stress: Not on file  ?Social Connections: Not on file  ?Intimate Partner Violence: Not on file  ?  ?Family History  ?Problem Relation Age of Onset  ? Stroke Mother   ? Cancer Father   ? Cancer Brother   ?  ? ? ?Review of systems complete and found to be negative unless listed above  ? ? ? ? ?PHYSICAL EXAM ? ?General: Well developed, well nourished, in no acute distress ?HEENT:  Normocephalic and atramatic ?Neck:  No JVD.  ?Lungs: Clear bilaterally to auscultation and percussion. ?Heart: HRRR . Normal S1 and S2 without gallops or  murmurs.  ?Abdomen: Bowel sounds are positive, abdomen soft and non-tender  ?Msk:  Back normal, normal gait. Normal strength and tone for age. ?Extremities: No clubbing, cyanosis or edema.   ?Neuro: Alert and oriented X 3. ?Psych:  Good affect, responds appropriately ? ?Labs: ?  ?Lab Results  ?Component Value Date  ? WBC 6.8 06/17/2021  ? HGB 12.5 06/17/2021  ? HCT 40.8 06/17/2021  ? MCV 96.2 06/17/2021  ? PLT 244 06/17/2021  ?  ?Recent Labs  ?Lab 06/17/21 ?1330  ?NA 132*  ?K 4.8  ?CL 93*  ?CO2 30  ?BUN 29*  ?CREATININE 1.14*  ?CALCIUM 9.2  ?GLUCOSE 109*  ? ?No results found for: CKTOTAL, CKMB, CKMBINDEX, TROPONINI  ?Lab Results  ?Component Value Date  ? CHOL 121 06/18/2021  ? ?Lab Results  ?Component Value Date  ? HDL 55 06/18/2021  ? ?Lab Results  ?Component Value Date  ? LDLCALC 57 06/18/2021  ? ?Lab Results  ?Component Value Date  ? TRIG 46 06/18/2021  ? ?Lab Results  ?Component Value Date  ? CHOLHDL 2.2 06/18/2021  ? ?No results found for: LDLDIRECT  ?  ?Radiology: DG Chest 2 View ? ?Result Date: 06/17/2021 ?CLINICAL DATA:  Chest pain. EXAM: CHEST - 2 VIEW COMPARISON:  None. FINDINGS: No consolidation. No visible pleural effusions or pneumothorax. Mild enlargement the cardiac silhouette. Tortuous aorta. No acute fracture. IMPRESSION: 1. No evidence of acute cardiopulmonary disease. 2. Mild cardiomegaly. Electronically Signed   By: Feliberto Harts M.D.   On: 06/17/2021 14:26  ? ?ECHOCARDIOGRAM COMPLETE ? ?Result Date: 06/18/2021 ?   ECHOCARDIOGRAM REPORT   Patient Name:   Bonnie Henson Date of Exam: 06/18/2021 Medical Rec #:  [id number]       Height:       63.0 in Accession #:    [id number]      Weight:       177.0 lb Date of Birth:  [birth year]       BSA:          1.836 m? Patient Age:    88 years        BP:           163/70 mmHg Patient Gender: F               HR:           50 bpm. Exam Location:  [facility] Procedure: 2D Echo Indications:     Chest Pain R07.9  History:         Patient has no prior history of  Echocardiogram examinations.  Sonographer:     Overton Mam RDCS Referring Phys:  [id number] TOCHUKWU AGBATA Diagnosing Phys: Alwyn Pea MD IMPRESSIONS  1. Left ventricular ejection fraction, by estimation, is 55 to 60%. The left ventricle has normal function. The left ventricle has no regional wall motion abnormalities. Left ventricular diastolic parameters are consistent with Grade I diastolic dysfunction (impaired relaxation).  2. Right ventricular systolic function is normal. The right ventricular size is normal.  3. The mitral valve is normal in structure. Trivial mitral valve regurgitation.  4. The aortic valve is normal in structure. Aortic valve regurgitation is mild. FINDINGS  Left Ventricle: Left ventricular ejection fraction, by estimation, is 55 to 60%. The left ventricle has normal function. The left ventricle has no regional wall motion abnormalities. The left ventricular internal cavity size was normal in size. There is  no left ventricular hypertrophy. Left ventricular diastolic parameters are consistent with Grade I diastolic dysfunction (impaired relaxation). Right Ventricle: The right ventricular size is normal. No increase in right ventricular wall thickness. Right ventricular systolic function is normal. Left Atrium: Left atrial size was normal in size. Right Atrium: Right atrial size was normal in size. Pericardium: There is no evidence of pericardial effusion. Mitral Valve: The mitral valve is normal in structure. Trivial mitral valve regurgitation. Tricuspid Valve: The tricuspid valve is normal in structure. Tricuspid valve regurgitation is mild. Aortic Valve: The aortic valve is normal in structure. Aortic valve regurgitation is mild. Aortic regurgitation PHT measures 588 msec. Aortic valve peak gradient measures 7.7 mmHg. Pulmonic Valve: The pulmonic valve was normal in structure. Pulmonic valve regurgitation is not visualized. Aorta: The ascending aorta was not well visualized.  IAS/Shunts: No atrial level shunt detected by color flow Doppler.  LEFT VENTRICLE PLAX 2D LVIDd:         4.35 cm     Diastology LVIDs:         3.05 cm     LV e' medial:    5.77 cm/s LV PW:         1.33 cm     LV E/e' medi

## 2021-06-18 NOTE — Progress Notes (Signed)
*  PRELIMINARY RESULTS* ?Echocardiogram ?2D Echocardiogram has been performed. ? ?Bonnie Henson ?06/18/2021, 9:24 AM ?

## 2021-06-18 NOTE — Progress Notes (Signed)
Mobility Specialist - Progress Note ? ? 06/18/21 1600  ?Mobility  ?Activity Ambulated independently in hallway  ?Level of Assistance Independent  ?Assistive Device Front wheel walker  ?Distance Ambulated (ft) 100 ft  ?Activity Response Tolerated well  ?$Mobility charge 1 Mobility  ? ? ? ?Pt standing at door upon arrival using RA eager to ambulate. Pt ambulates 166ft indep voicing no complaints and returns to chair with needs in reach. ? ?Bonnie Henson ?Mobility Specialist ?06/18/21, 4:10 PM ? ? ? ? ?

## 2021-06-19 DIAGNOSIS — R079 Chest pain, unspecified: Secondary | ICD-10-CM | POA: Diagnosis not present

## 2021-06-19 MED ORDER — ISOSORBIDE MONONITRATE ER 60 MG PO TB24: 90.0000 mg | ORAL_TABLET | Freq: Every day | ORAL | 0 refills | Status: AC

## 2021-06-19 MED ORDER — HYDRALAZINE HCL 25 MG PO TABS
25.0000 mg | ORAL_TABLET | Freq: Three times a day (TID) | ORAL | 0 refills | Status: DC
Start: 2021-06-19 — End: 2023-10-24

## 2021-06-19 NOTE — Progress Notes (Signed)
[facility] ? ? ? ?SUBJECTIVE: Patient feels much improved reduced chest pain.  Patient has not required any sublingual nitroglycerin.  Patient has done better on 90 mg of increased dose of Imdur has reiterated no interest in cardiac cath and would like to be treated medically ? ? ?Vitals:  ? 06/18/21 1946 06/18/21 2348 06/19/21 0316 06/19/21 0740  ?BP: (!) 152/63 (!) 113/59 (!) 146/60 (!) 106/56  ?Pulse: (!) 51 (!) 57 (!) 47 (!) 58  ?Resp: 16 16 16 16   ?Temp: 98.3 ?F (36.8 ?C) 98.1 ?F (36.7 ?C) 97.8 ?F (36.6 ?C) 97.9 ?F (36.6 ?C)  ?TempSrc:      ?SpO2: 95% 97% 93% 97%  ?Weight:      ?Height:      ? ? ? ?Intake/Output Summary (Last 24 hours) at 06/19/2021 1033 ?Last data filed at 06/19/2021 [id number] ?Gross per 24 hour  ?Intake 500 ml  ?Output --  ?Net 500 ml  ? ? ? ? ?PHYSICAL EXAM ? ?General: Well developed, well nourished, in no acute distress ?HEENT:  Normocephalic and atramatic ?Neck:  No JVD.  ?Lungs: Clear bilaterally to auscultation and percussion. ?Heart: HRRR . Normal S1 and S2 without gallops or 2/6 sem murmurs.  ?Abdomen: Bowel sounds are positive, abdomen soft and non-tender  ?Msk:  Back normal, normal gait. Normal strength and tone for age. ?Extremities: No clubbing, cyanosis or edema.   ?Neuro: Alert and oriented X 3. ?Psych:  Good affect, responds appropriately ? ? ?LABS: ?Basic Metabolic Panel: ?Recent Labs  ?  06/17/21 ?1330  ?NA 132*  ?K 4.8  ?CL 93*  ?CO2 30  ?GLUCOSE 109*  ?BUN 29*  ?CREATININE 1.14*  ?CALCIUM 9.2  ? ?Liver Function Tests: ?No results for input(s): AST, ALT, ALKPHOS, BILITOT, PROT, ALBUMIN in the last 72 hours. ?No results for input(s): LIPASE, AMYLASE in the last 72 hours. ?CBC: ?Recent Labs  ?  06/17/21 ?1330  ?WBC 6.8  ?HGB 12.5  ?HCT 40.8  ?MCV 96.2  ?PLT 244  ? ?Cardiac Enzymes: ?No results for input(s): CKTOTAL, CKMB, CKMBINDEX, TROPONINI in the last 72 hours. ?BNP: ?Invalid input(s): POCBNP ?D-Dimer: ?No results for input(s): DDIMER in the last 72 hours. ?Hemoglobin A1C: ?No  results for input(s): HGBA1C in the last 72 hours. ?Fasting Lipid Panel: ?Recent Labs  ?  06/18/21 ?0106  ?CHOL 121  ?HDL 55  ?LDLCALC 57  ?TRIG 46  ?CHOLHDL 2.2  ? ?Thyroid Function Tests: ?Recent Labs  ?  06/17/21 ?1605  ?TSH 0.310*  ? ?Anemia Panel: ?No results for input(s): VITAMINB12, FOLATE, FERRITIN, TIBC, IRON, RETICCTPCT in the last 72 hours. ? ?DG Chest 2 View ? ?Result Date: 06/17/2021 ?CLINICAL DATA:  Chest pain. EXAM: CHEST - 2 VIEW COMPARISON:  None. FINDINGS: No consolidation. No visible pleural effusions or pneumothorax. Mild enlargement the cardiac silhouette. Tortuous aorta. No acute fracture. IMPRESSION: 1. No evidence of acute cardiopulmonary disease. 2. Mild cardiomegaly. Electronically Signed   By: Feliberto HartsFrederick S Jones M.D.   On: 06/17/2021 14:26  ? ?ECHOCARDIOGRAM COMPLETE ? ?Result Date: 06/18/2021 ?   ECHOCARDIOGRAM REPORT   Patient Name:   Bonnie Henson Date of Exam: 06/18/2021 Medical Rec #:  [id number]       Height:       63.0 in Accession #:    [id number]      Weight:       177.0 lb Date of Birth:  30-Jul-1929       BSA:  1.836 m? Patient Age:    88 years        BP:           163/70 mmHg Patient Gender: F               HR:           50 bpm. Exam Location:  [facility] Procedure: 2D Echo Indications:     Chest Pain R07.9  History:         Patient has no prior history of Echocardiogram examinations.  Sonographer:     Overton Mam RDCS Referring Phys:  [id number] AGBATA Diagnosing Phys: Alwyn Pea MD IMPRESSIONS  1. Left ventricular ejection fraction, by estimation, is 55 to 60%. The left ventricle has normal function. The left ventricle has no regional wall motion abnormalities. Left ventricular diastolic parameters are consistent with Grade I diastolic dysfunction (impaired relaxation).  2. Right ventricular systolic function is normal. The right ventricular size is normal.  3. The mitral valve is normal in structure. Trivial mitral valve regurgitation.  4. The aortic valve is  normal in structure. Aortic valve regurgitation is mild. FINDINGS  Left Ventricle: Left ventricular ejection fraction, by estimation, is 55 to 60%. The left ventricle has normal function. The left ventricle has no regional wall motion abnormalities. The left ventricular internal cavity size was normal in size. There is  no left ventricular hypertrophy. Left ventricular diastolic parameters are consistent with Grade I diastolic dysfunction (impaired relaxation). Right Ventricle: The right ventricular size is normal. No increase in right ventricular wall thickness. Right ventricular systolic function is normal. Left Atrium: Left atrial size was normal in size. Right Atrium: Right atrial size was normal in size. Pericardium: There is no evidence of pericardial effusion. Mitral Valve: The mitral valve is normal in structure. Trivial mitral valve regurgitation. Tricuspid Valve: The tricuspid valve is normal in structure. Tricuspid valve regurgitation is mild. Aortic Valve: The aortic valve is normal in structure. Aortic valve regurgitation is mild. Aortic regurgitation PHT measures 588 msec. Aortic valve peak gradient measures 7.7 mmHg. Pulmonic Valve: The pulmonic valve was normal in structure. Pulmonic valve regurgitation is not visualized. Aorta: The ascending aorta was not well visualized. IAS/Shunts: No atrial level shunt detected by color flow Doppler.  LEFT VENTRICLE PLAX 2D LVIDd:         4.35 cm     Diastology LVIDs:         3.05 cm     LV e' medial:    5.77 cm/s LV PW:         1.33 cm     LV E/e' medial:  15.0 LV IVS:        1.10 cm     LV e' lateral:   5.55 cm/s LVOT diam:     2.00 cm     LV E/e' lateral: 15.6 LV SV:         94 LV SV Index:   51 LVOT Area:     3.14 cm?  LV Volumes (MOD) LV vol d, MOD A2C: 53.9 ml LV vol d, MOD A4C: 67.8 ml LV vol s, MOD A2C: 17.7 ml LV vol s, MOD A4C: 29.7 ml LV SV MOD A2C:     36.2 ml LV SV MOD A4C:     67.8 ml LV SV MOD BP:      37.3 ml RIGHT VENTRICLE RV Basal diam:  3.05  cm RV S prime:     11.00 cm/s TAPSE (M-mode):  1.4 cm LEFT ATRIUM             Index        RIGHT ATRIUM           Index LA diam:        3.70 cm 2.02 cm/m?   RA Area:     14.50 cm? LA Vol (A2C):   45.8 ml 24.95 ml/m?  RA Volume:   32.30 ml  17.59 ml/m? LA Vol (A4C):   56.0 ml 30.50 ml/m? LA Biplane Vol: 54.2 ml 29.52 ml/m?  AORTIC VALVE                 PULMONIC VALVE AV Area (Vmax): 2.49 cm?     PV Vmax:          1.22 m/s AV Vmax:        139.00 cm/s  PV Peak grad:     6.0 mmHg AV Peak Grad:   7.7 mmHg     PR End Diast Vel: 5.57 msec LVOT Vmax:      110.00 cm/s LVOT Vmean:     72.900 cm/s LVOT VTI:       0.298 m AI PHT:         588 msec  AORTA Ao Root diam: 3.10 cm Ao Asc diam:  3.50 cm MITRAL VALVE                TRICUSPID VALVE MV Area (PHT): 1.83 cm?     TV Peak grad:   26.4 mmHg MV Decel Time: 415 msec     TV Vmax:        2.57 m/s MV E velocity: 86.40 cm/s MV A velocity: 106.00 cm/s  SHUNTS MV E/A ratio:  0.82         Systemic VTI:  0.30 m                             Systemic Diam: 2.00 cm Alwyn Pea MD Electronically signed by Alwyn Pea MD Signature Date/Time: 06/18/2021/12:51:39 PM    Final    ? ? ?Echo preserved left ventricular function EF of 55 to 60% ? ?TELEMETRY: Sinus bradycardia rate around 45-50: ? ?ASSESSMENT AND PLAN: ? ?Principal Problem: ?  Chest pain ?Active Problems: ?  Thyroid disease ?  Hypertension ?  Sleep apnea ?  Sinus bradycardia ?Possible angina ?Probable coronary disease ?Hyperlipidemia ? ? ?Plan ?Recommend increase Imdur for possible anginal symptoms and proceed with medical therapy ?Discussions about possible invasive investigation including cardiac cath and the patient has decided not to pursue that avenue and would prefer medical ?Recommend hypertension management and control with amlodipine and ramipril ?Sleep study CPAP if indicated recommend modest weight loss ?Continue Crestor therapy for hyperlipidemia ?Have the patient follow-up with primary cardiologist at [facility] Dr.  Jacqlyn Krauss ? ? ? ?Alwyn Pea, MD ?06/19/2021 ?10:33 AM ? ? ? ?  ?

## 2021-06-19 NOTE — Evaluation (Signed)
Occupational Therapy Evaluation ?Patient Details ?Name: Bonnie IhaVirginia W Henson ?MRN: [id number] ?DOB: 07-21-1929 ?Today's Date: 06/19/2021 ? ? ?History of Present Illness Patient is a 88 year old female with medical history significant for CAD, hypothyroidism, hypertension, neuropathy who presents to the ER via EMS for evaluation of chest pressure. Noted to be bradycardic on admission, associated with symptoms of generalized weakness and dizziness  ? ?Clinical Impression ?  ?Bonnie Henson presents with mild chest pain/heaviness (2/10 pain) and moderate L-sided head and neck pain (6/10 pain), the latter of which she describes as a chronic condition. During today's evaluation, she is able to perform toileting, grooming in standing, dressing, ambulation, all INDly, no AD needed, no LOB noted. Pt did reach out to wall or furniture for steadying on occasion. Pt appears to be at her baseline level of fxl mobility. She reports [facility] lives [facility], which provides 1 meal per day. Bonnie Henson walks to dining hall, walks frequently on grounds of the community, engages in games and outings with other residents. Until several months ago, she used to go to a pool for water aerobics, but stopped when she began caring for her sick son. He has now passed, and pt hopes to resume that activity again. Pt and therapist in agreement that no additional OT services are needed at this time. Pt will see a neurologist soon re: her chronic neck/head pain.   ? ?Recommendations for follow up therapy are one component of a multi-disciplinary discharge planning process, led by the attending physician.  Recommendations may be updated based on patient status, additional functional criteria and insurance authorization.  ? ?Follow Up Recommendations ? No OT follow up  ?  ?Assistance Recommended at Discharge PRN  ?Patient can return home with the following   ? ?  ?Functional Status Assessment ? Patient has not had a recent decline in their  functional status  ?Equipment Recommendations ? None recommended by OT  ?  ?Recommendations for Other Services   ? ? ?  ?Precautions / Restrictions Precautions ?Precautions: None ?Precaution Comments: Mod fall risk ?Restrictions ?Weight Bearing Restrictions: No  ? ?  ? ?Mobility Bed Mobility ?Overal bed mobility: Modified Independent ?  ?  ?  ?  ?  ?  ?  ?  ? ?Transfers ?Overall transfer level: Modified independent ?  ?  ?  ?  ?  ?  ?  ?  ?General transfer comment: no AD needed w/in room ?  ? ?  ?Balance Overall balance assessment: Needs assistance ?  ?Sitting balance-Leahy Scale: Normal ?  ?  ?Standing balance support: No upper extremity supported ?Standing balance-Leahy Scale: Good ?  ?  ?  ?  ?  ?  ?  ?  ?  ?  ?  ?  ?   ? ?ADL either performed or assessed with clinical judgement  ? ?ADL Overall ADL's : At baseline;Needs assistance/impaired ?  ?  ?Grooming: Wash/dry hands;Wash/dry face;Standing;Modified independent ?  ?  ?  ?  ?  ?  ?  ?Lower Body Dressing: Supervision/safety;Sit to/from stand ?Lower Body Dressing Details (indicate cue type and reason): donning underwear and pants ?Toilet Transfer: StatisticianModified Independent;Stand-pivot;Regular Toilet ?  ?Toileting- ArchitectClothing Manipulation and Hygiene: Independent;Sit to/from stand ?  ?  ?  ?Functional mobility during ADLs: Modified independent ?   ? ? ? ?Vision Patient Visual Report: No change from baseline ?   ?   ?Perception   ?  ?Praxis   ?  ? ?Pertinent  Vitals/Pain Pain Assessment ?Pain Assessment: 0-10 ?Pain Score: 6  ?Pain Location: "heaviness" in chest (2/10); chronic throbbing pain in L side of head/neck (6/10) ?Pain Descriptors / Indicators: Heaviness, Throbbing ?Pain Intervention(s): Repositioned, Limited activity within patient's tolerance  ? ? ? ?Hand Dominance Right ?  ?Extremity/Trunk Assessment Upper Extremity Assessment ?Upper Extremity Assessment: Overall WFL for tasks assessed ?  ?Lower Extremity Assessment ?Lower Extremity Assessment: Overall WFL for  tasks assessed ?  ?  ?  ?Communication Communication ?Communication: No difficulties ?  ?Cognition Arousal/Alertness: Awake/alert ?Behavior During Therapy: [facility] for tasks assessed/performed ?Overall Cognitive Status: Within Functional Limits for tasks assessed ?  ?  ?  ?  ?  ?  ?  ?  ?  ?  ?  ?  ?  ?  ?  ?  ?General Comments: patient able to follow all commands without difficulty ?  ?  ?General Comments    ? ?  ?Exercises   ?  ?Shoulder Instructions    ? ? ?Home Living Family/patient expects to be discharged to:: Assisted living ?  ?  ?  ?  ?  ?  ?  ?  ?  ?  ?  ?  ?  ?  ?Home Equipment: Rollator (4 wheels) ?  ?Additional Comments: has one meal a day provided by ALF ?  ? ?  ?Prior Functioning/Environment Prior Level of Function : Independent/Modified Independent ?  ?  ?  ?  ?  ?  ?Mobility Comments: using rollator for ambulation ?ADLs Comments: she reports she is independent ?  ? ?  ?  ?OT Problem List: Decreased activity tolerance;Pain ?  ?   ?OT Treatment/Interventions:    ?  ?OT Goals(Current goals can be found in the care plan section) Acute Rehab OT Goals ?Patient Stated Goal: to start driving again so that she can go to pool ?OT Goal Formulation: With patient ?Time For Goal Achievement: 07/03/21 ?Potential to Achieve Goals: Good  ?OT Frequency:   ?  ? ?Co-evaluation   ?  ?  ?  ?  ? ?  ?AM-PAC OT "6 Clicks" Daily Activity     ?Outcome Measure Help from another person eating meals?: None ?Help from another person taking care of personal grooming?: None ?Help from another person toileting, which includes using toliet, bedpan, or urinal?: None ?Help from another person bathing (including washing, rinsing, drying)?: None ?Help from another person to put on and taking off regular upper body clothing?: None ?Help from another person to put on and taking off regular lower body clothing?: None ?6 Click Score: 24 ?  ?End of Session   ? ?Activity Tolerance: Patient tolerated treatment well ?Patient left: in bed;with call  bell/phone within reach ? ?OT Visit Diagnosis: Muscle weakness (generalized) (M62.81);Pain  ?              ?Time: [id number] ?OT Time Calculation (min): 18 min ?Charges:  OT General Charges ?$OT Visit: 1 Visit ?OT Evaluation ?$OT Eval Low Complexity: 1 Low ?OT Treatments ?$Self Care/Home Management : 8-22 mins ?Josiah Lobo, PhD, MS, OTR/L ?06/19/21, 10:50 AM ? ?

## 2021-06-19 NOTE — Discharge Summary (Signed)
Physician Discharge Summary  ?Bonnie IhaVirginia W Henson ZOX:[id number] DOB: 1929-07-01 DOA: 06/17/2021 ? ?PCP: Filbert BertholdSatter, Jane, MD ? ?Admit date: 06/17/2021 ?Discharge date: 06/19/2021 ? ?Admitted From: Independent living ?Disposition: Independent living ? ?Recommendations for Outpatient Follow-up:  ?Follow up with PCP in 1-2 weeks ?[facility] ? ?Home Health: No ?Equipment/Devices: None ? ?Discharge Condition: Stable ?CODE STATUS: DNR ?Diet recommendation: Cardiac ? ?Brief/Interim Summary: ?88 y.o. female with medical history significant for CAD, hypothyroidism, hypertension, neuropathy who presents to the ER via EMS for evaluation of chest pressure which started at rest.  Chest pressure was intermittent, midsternal and was associated with nausea.  She denied having any diaphoresis, no shortness of breath, no palpitations and there was no radiation of her symptoms.  She took 3 sublingual nitroglycerin tablets with transient improvement in her symptoms.  She contacted her primary care provider who advised her to go to the ER for further evaluation. ?She complains of generalized weakness and feeling dizzy but denies having any falls or loss of consciousness. ?Chart review was done and patient had an abnormal stress nuclear study in 2020 which revealed anterior ischemia with an LVEF of 69%.  She declined cardiac catheterization at the time and is being treated medically. ?  ?On my evaluation patient is resting comfortably in bed.  She has no visible distress.  She continues to endorse occasional substernal chest pressure.  No aggravating or alleviating factors.  Patient did respond to nitroglycerin with improvement in her chest pain. ?  ?Cardiology consulted on admission.  Recommend medical management.  Suspect sick sinus syndrome secondary to untreated obstructive sleep apnea.  Increase nitrates and hydralazine at time of discharge for better anginal control.  No plans for cardiac catheterization or ischemic  evaluation.  Patient is [facility]  Also recommended to see her PCP and consideration for outpatient sleep study. ? ? ? ?Discharge Diagnoses:  ?Principal Problem: ?  Chest pain ?Active Problems: ?  Sinus bradycardia ?  Thyroid disease ?  Hypertension ?  Sleep apnea ? ?Chest pain ?Patient with known history of CAD ?Chest pain with typical features, midsternal, pressure in nature ?Symptoms relieved with sublingual nitroglycerin ?History of abnormal stress test ?Has declined cardiac catheterization in the past ?Troponin flat with no significant delta ?TTE reassuring ?Plan: ?Discharge back to assisted living facility.  Increase Imdur to 90 mg daily.  Increase hydralazine to 25 mg 3 times daily.  Continue statins and aspirin for risk factor modification.  Aggressive medical management.  [facility]. ?  ?Symptomatic bradycardia ?Noted to be bradycardic on admission ?Associated with symptoms of generalized weakness and dizziness ?No falls or loss of consciousness ?Possibly related to underlying sleep apnea ?No AV nodal blocking agents on MAR ?Suspect sick sinus syndrome secondary to untreated sleep apnea ?Plan: ?Avoid any AV nodal blocking agents including beta-blockers and calcium channel blockers.  Strongly recommend outpatient evaluation for sleep study ?  ?Essential hypertension ?Blood pressure controlled ?Increase hydralazine to 25 mg p.o. 3 times daily ?Increase Imdur to 90 mg daily ? ?Hypothyroidism ?PTA Synthroid ?  ?Sleep apnea ?Not on home CPAP ?Recommend outpatient evaluation for polysomnography ? ?Discharge Instructions ? ?Discharge Instructions   ? ? Diet - low sodium heart healthy   Complete by: As directed ?  ? Increase activity slowly   Complete by: As directed ?  ? ?  ? ?Allergies as of 06/19/2021   ? ?   Reactions  ? Rosuvastatin Other (See Comments)  ?  Muscle pain  ? ?  ? ?  ?Medication List  ?  ? ?TAKE these medications   ? ?amLODipine  5 MG tablet ?Commonly known as: NORVASC ?Take 5 mg by mouth daily. ?  ?aspirin EC 81 MG tablet ?Take 81 mg by mouth daily. ?  ?atorvastatin 20 MG tablet ?Commonly known as: LIPITOR ?Take 20 mg by mouth daily. ?  ?escitalopram 20 MG tablet ?Commonly known as: LEXAPRO ?Take 20 mg by mouth daily. ?  ?Euthyrox 88 MCG tablet ?Generic drug: levothyroxine ?Take 88 mcg by mouth every morning. ?  ?furosemide 20 MG tablet ?Commonly known as: LASIX ?Take 1 tablet by mouth daily as needed. ?  ?gabapentin 300 MG capsule ?Commonly known as: NEURONTIN ?Take 300-600 mg by mouth 2 (two) times daily. 300 mg qam ?600 mg qpm ?  ?GLUCOSAMINE 1500 COMPLEX PO ?Take 1 tablet by mouth daily. ?  ?hydrALAZINE 25 MG tablet ?Commonly known as: APRESOLINE ?Take 1 tablet (25 mg total) by mouth 3 (three) times daily. ?What changed: when to take this ?  ?isosorbide mononitrate 60 MG 24 hr tablet ?Commonly known as: IMDUR ?Take 1.5 tablets (90 mg total) by mouth daily. ?What changed: how much to take ?  ?nitroGLYCERIN 0.6 MG SL tablet ?Commonly known as: NITROSTAT ?Place 0.6 mg under the tongue every 5 (five) minutes as needed for chest pain. ?  ?ramipril 10 MG capsule ?Commonly known as: ALTACE ?Take 10 mg by mouth daily. ?  ?spironolactone 25 MG tablet ?Commonly known as: ALDACTONE ?Take 25 mg by mouth daily. ?  ? ?  ? ? Follow-up Information   ? ? Filbert Berthold, MD. Schedule an appointment as soon as possible for a visit in 1 week(s).   ?Specialty: Family Medicine ?Why: Make next available appointment.  Suspect that you have underlying sleep apnea, which may be causing symptoms of low heart rate.  Ask about referral to pulmonary doctor for sleep study ?Contact information: ?[address] ?[address] ?[place] [address] ?[phone] ? ? ?  ?  ? ? Sketch, Fabio Neighbors., MD. Schedule an appointment as soon as possible for a visit in 1 week(s).   ?Specialty: Cardiology ?Contact information: ?[address] ?[place]  [address] ?[phone] ? ? ?  ?  ? ?  ?  ? ?  ? ?Allergies  ?Allergen Reactions  ? Rosuvastatin Other (See Comments)  ?  Muscle pain  ? ? ?Consultations: ?Cardiology ? ? ?Procedures/Studies: ?DG Chest 2 View ? ?Result Date: 06/17/2021 ?CLINICAL DATA:  Chest pain. EXAM: CHEST - 2 VIEW COMPARISON:  None. FINDINGS: No consolidation. No visible pleural effusions or pneumothorax. Mild enlargement the cardiac silhouette. Tortuous aorta. No acute fracture. IMPRESSION: 1. No evidence of acute cardiopulmonary disease. 2. Mild cardiomegaly. Electronically Signed   By: Feliberto Harts M.D.   On: 06/17/2021 14:26  ? ?ECHOCARDIOGRAM COMPLETE ? ?Result Date: 06/18/2021 ?   ECHOCARDIOGRAM REPORT   Patient Name:   Bonnie Henson Date of Exam: 06/18/2021 Medical Rec #:  [id number]       Height:       63.0 in Accession #:    [id number]      Weight:       177.0 lb Date of Birth:  1929/11/18       BSA:          1.836 m? Patient Age:    88 years        BP:  163/70 mmHg Patient Gender: F               HR:           50 bpm. Exam Location:  [facility] Procedure: 2D Echo Indications:     Chest Pain R07.9  History:         Patient has no prior history of Echocardiogram examinations.  Sonographer:     Overton Mam RDCS Referring Phys:  [id number] AGBATA Diagnosing Phys: Alwyn Pea MD IMPRESSIONS  1. Left ventricular ejection fraction, by estimation, is 55 to 60%. The left ventricle has normal function. The left ventricle has no regional wall motion abnormalities. Left ventricular diastolic parameters are consistent with Grade I diastolic dysfunction (impaired relaxation).  2. Right ventricular systolic function is normal. The right ventricular size is normal.  3. The mitral valve is normal in structure. Trivial mitral valve regurgitation.  4. The aortic valve is normal in structure. Aortic valve regurgitation is mild. FINDINGS  Left Ventricle: Left ventricular ejection fraction, by estimation, is 55 to 60%. The left ventricle  has normal function. The left ventricle has no regional wall motion abnormalities. The left ventricular internal cavity size was normal in size. There is  no left ventricular hypertrophy. Left ventricular diastolic par

## 2021-06-19 NOTE — Progress Notes (Signed)
Mobility Specialist - Progress Note ? ? ? 06/19/21 1100  ?Mobility  ?Activity Ambulated independently in hallway;Stood at bedside;Dangled on edge of bed  ?Level of Assistance Independent  ?Assistive Device Front wheel walker  ?Distance Ambulated (ft) 100 ft  ?Activity Response Tolerated well  ?$Mobility charge 1 Mobility  ? ? ?Pt supine upon arrival using RA. Pt completes bed mobility and STS indep and ambulates indep voicing no complaints. Pt is left EOB with needs in reach. ? ?Clarisa Schools ?Mobility Specialist ?06/19/21, 11:46 AM ? ? ? ? ?

## 2021-06-19 NOTE — TOC Transition Note (Signed)
Transition of Care (TOC) - CM/SW Discharge Note ? ? ?Patient Details  ?Name: Dyanne Iha ?MRN: [id number] ?Date of Birth: [birth year] ? ?Transition of Care (TOC) CM/SW Contact:  ?Susa Simmonds, LCSWA ?Phone Number: ?06/19/2021, 10:01 AM ? ? ?Clinical Narrative:   CSW contacted patients granddaughter Kathline Magic who currently has patients key to get into her apartment. Lauren stated that she will not be able to pick patient up until 1:30-2:00. Lauren will let CSW know if she can find someone with a key closer to pick-up patient.  ? ? ? ?  ?Barriers to Discharge: Continued Medical Work up ? ? ?Patient Goals and CMS Choice ?Patient states their goals for this hospitalization and ongoing recovery are:: to go home ?CMS Medicare.gov Compare Post Acute Care list provided to:: Patient ?  ? ?Discharge Placement ?  ?           ?  ?  ?  ?  ? ?Discharge Plan and Services ?  ?  ?           ?  ?  ?  ?  ?  ?  ?  ?  ?  ?  ? ?Social Determinants of Health (SDOH) Interventions ?  ? ? ?Readmission Risk Interventions ?   ? View : No data to display.  ?  ?  ?  ? ? ? ? ? ?

## 2021-06-19 NOTE — Evaluation (Addendum)
Physical Therapy Evaluation ?Patient Details ?Name: Bonnie Henson ?MRN: [id number] ?DOB: 16-Feb-1930 ?Today's Date: 06/19/2021 ? ?History of Present Illness ? Patient is a 88 year old female with medical history significant for CAD, hypothyroidism, hypertension, neuropathy who presents to the ER via EMS for evaluation of chest pressure. Noted to be bradycardic on admission, associated with symptoms of generalized weakness and dizziness  ?Clinical Impression ? Patient is cooperative and agreeable to PT. She reports she lives at ALF and ambulates with a four wheeled walker at baseline.  ?The patient did not require physical assistance for any activity today. She was able to stand from the chair, ambulate in hallway with rolling walker, and get into the bed with modified independence. Education provided for rest breaks and activity cessation as needed for fatigue. She often uses her seat on the four wheeled walker for seated rest break when ambulating longer distance around her community. She is likely at her baseline level of functional mobility. No PT needs are identified at this time. Anticipate patient will be able to safety discharge back to previous environment.   ?   ? ?Recommendations for follow up therapy are one component of a multi-disciplinary discharge planning process, led by the attending physician.  Recommendations may be updated based on patient status, additional functional criteria and insurance authorization. ? ?Follow Up Recommendations No PT follow up ? ?  ?Assistance Recommended at Discharge PRN (lives at ALF already)  ?Patient can return home with the following ? Assist for transportation ? ?  ?Equipment Recommendations None recommended by PT  ?Recommendations for Other Services ?    ?  ?Functional Status Assessment Patient has not had a recent decline in their functional status  ? ?  ?Precautions / Restrictions Precautions ?Precautions: Fall ?Restrictions ?Weight Bearing Restrictions: No  ? ?   ? ?Mobility ? Bed Mobility ?Overal bed mobility: Modified Independent ?  ?  ?  ?  ?  ?  ?General bed mobility comments: for sitting to supine with head of bed flat and no use of bed rail ?  ? ?Transfers ?Overall transfer level: Modified independent ?Equipment used: Rolling walker (2 wheels) ?  ?  ?  ?  ?  ?  ?  ?General transfer comment: good safety awareness demonstrated ?  ? ?Ambulation/Gait ?Ambulation/Gait assistance: Modified independent (Device/Increase time) ?Gait Distance (Feet): 125 Feet ?Assistive device: Rolling walker (2 wheels) ?Gait Pattern/deviations: Step-through pattern ?Gait velocity: decreased ?  ?  ?General Gait Details: no loss of balance with ambulation. no shortness of breath noted with ambulation. educated patient on energy conservation and tips on need for rest breaks. heart rate in the 90's after walking ? ?Stairs ?  ?  ?  ?  ?  ? ?Wheelchair Mobility ?  ? ?Modified Rankin (Stroke Patients Only) ?  ? ?  ? ?Balance Overall balance assessment: Needs assistance ?Sitting-balance support: No upper extremity supported ?Sitting balance-Leahy Scale: Normal ?  ?  ?Standing balance support: No upper extremity supported ?Standing balance-Leahy Scale: Fair ?  ?  ?  ?  ?  ?  ?  ?  ?  ?  ?  ?  ?   ? ? ? ?Pertinent Vitals/Pain Pain Assessment ?Pain Assessment: Faces ?Faces Pain Scale: Hurts a little bit ?Pain Location: chronic left neck pain ?Pain Descriptors / Indicators: Radiating ?Pain Intervention(s): Limited activity within patient's tolerance  ? ? ?Home Living Family/patient expects to be discharged to:: Assisted living ?  ?  ?  ?  ?  ?  ?  ?  ?  Home Equipment: Rollator (4 wheels) ?Additional Comments: has one meal a day provided by ALF  ?  ?Prior Function Prior Level of Function : Independent/Modified Independent ?  ?  ?  ?  ?  ?  ?Mobility Comments: using rollator for ambulation ?ADLs Comments: she reports she is independent ?  ? ? ?Hand Dominance  ?   ? ?  ?Extremity/Trunk Assessment  ? Upper  Extremity Assessment ?Upper Extremity Assessment: Overall WFL for tasks assessed ?  ? ?Lower Extremity Assessment ?Lower Extremity Assessment: Overall WFL for tasks assessed ?  ? ?   ?Communication  ? Communication: No difficulties  ?Cognition Arousal/Alertness: Awake/alert ?Behavior During Therapy: [facility] for tasks assessed/performed ?Overall Cognitive Status: Within Functional Limits for tasks assessed ?  ?  ?  ?  ?  ?  ?  ?  ?  ?  ?  ?  ?  ?  ?  ?  ?General Comments: patient able to follow all commands without difficulty ?  ?  ? ?  ?General Comments   ? ?  ?Exercises    ? ?Assessment/Plan  ?  ?PT Assessment Patient does not need any further PT services  ?PT Problem List   ? ?   ?  ?PT Treatment Interventions     ? ?PT Goals (Current goals can be found in the Care Plan section)  ?  ? ?  ?Frequency  All assessment and education complete, DC therapy ?  ? ? ?Co-evaluation   ?  ?  ?  ?  ? ? ?  ?AM-PAC PT "6 Clicks" Mobility  ?Outcome Measure Help needed turning from your back to your side while in a flat bed without using bedrails?: None ?Help needed moving from lying on your back to sitting on the side of a flat bed without using bedrails?: None ?Help needed moving to and from a bed to a chair (including a wheelchair)?: None ?Help needed standing up from a chair using your arms (e.g., wheelchair or bedside chair)?: None ?Help needed to walk in hospital room?: None ?Help needed climbing 3-5 steps with a railing? : None ?6 Click Score: 24 ? ?  ?End of Session Equipment Utilized During Treatment: Gait belt ?Activity Tolerance: Patient tolerated treatment well ?Patient left: in bed;with call bell/phone within reach;with bed alarm set ?  ?PT Visit Diagnosis: Muscle weakness (generalized) (M62.81) ?  ? ?Time: 3536-1443 ?PT Time Calculation (min) (ACUTE ONLY): 20 min ? ? ?Charges:   PT Evaluation ?$PT Eval Low Complexity: 1 Low ?PT Treatments ?$Gait Training: 8-22 mins ?  ?   ? ? ?Donna Bernard, PT, MPT ? ? ?Bonnie Henson ?06/19/2021, 9:33 AM ? ?

## 2021-06-20 ENCOUNTER — Encounter: Payer: Medicare Other | Admitting: Physical Therapy

## 2021-06-23 ENCOUNTER — Encounter: Payer: Medicare Other | Admitting: Physical Therapy

## 2021-10-12 ENCOUNTER — Ambulatory Visit: Admit: 2021-10-12 | Payer: Medicare Other | Admitting: Ophthalmology

## 2021-10-12 SURGERY — PHACOEMULSIFICATION, CATARACT, WITH IOL INSERTION
Anesthesia: Topical | Laterality: Left

## 2021-10-26 ENCOUNTER — Ambulatory Visit: Admit: 2021-10-26 | Payer: Medicare Other | Admitting: Ophthalmology

## 2021-10-26 SURGERY — PHACOEMULSIFICATION, CATARACT, WITH IOL INSERTION
Anesthesia: Topical | Laterality: Right

## 2023-03-05 ENCOUNTER — Emergency Department: Payer: Self-pay

## 2023-03-05 ENCOUNTER — Other Ambulatory Visit: Payer: Self-pay

## 2023-03-05 ENCOUNTER — Emergency Department: Payer: Medicare Other

## 2023-03-05 ENCOUNTER — Observation Stay: Payer: Self-pay

## 2023-03-05 ENCOUNTER — Observation Stay
Admission: EM | Admit: 2023-03-05 | Discharge: 2023-03-06 | Disposition: A | Discharge status: Home / Self Care | Payer: Medicare Other | Attending: Obstetrics and Gynecology | Admitting: Obstetrics and Gynecology

## 2023-03-05 DIAGNOSIS — I10 Essential (primary) hypertension: Secondary | ICD-10-CM | POA: Diagnosis present

## 2023-03-05 DIAGNOSIS — I509 Heart failure, unspecified: Secondary | ICD-10-CM

## 2023-03-05 DIAGNOSIS — Z7901 Long term (current) use of anticoagulants: Secondary | ICD-10-CM | POA: Diagnosis not present

## 2023-03-05 DIAGNOSIS — M5481 Occipital neuralgia: Secondary | ICD-10-CM | POA: Insufficient documentation

## 2023-03-05 DIAGNOSIS — R42 Dizziness and giddiness: Principal | ICD-10-CM | POA: Insufficient documentation

## 2023-03-05 DIAGNOSIS — R001 Bradycardia, unspecified: Secondary | ICD-10-CM | POA: Insufficient documentation

## 2023-03-05 DIAGNOSIS — G4730 Sleep apnea, unspecified: Secondary | ICD-10-CM | POA: Diagnosis not present

## 2023-03-05 DIAGNOSIS — M5412 Radiculopathy, cervical region: Secondary | ICD-10-CM

## 2023-03-05 DIAGNOSIS — I214 Non-ST elevation (NSTEMI) myocardial infarction: Secondary | ICD-10-CM

## 2023-03-05 DIAGNOSIS — Z8673 Personal history of transient ischemic attack (TIA), and cerebral infarction without residual deficits: Secondary | ICD-10-CM | POA: Insufficient documentation

## 2023-03-05 DIAGNOSIS — E039 Hypothyroidism, unspecified: Secondary | ICD-10-CM | POA: Diagnosis not present

## 2023-03-05 DIAGNOSIS — Z950 Presence of cardiac pacemaker: Secondary | ICD-10-CM | POA: Diagnosis not present

## 2023-03-05 DIAGNOSIS — I110 Hypertensive heart disease with heart failure: Secondary | ICD-10-CM | POA: Diagnosis not present

## 2023-03-05 DIAGNOSIS — Z79899 Other long term (current) drug therapy: Secondary | ICD-10-CM | POA: Diagnosis not present

## 2023-03-05 DIAGNOSIS — I5032 Chronic diastolic (congestive) heart failure: Secondary | ICD-10-CM | POA: Insufficient documentation

## 2023-03-05 DIAGNOSIS — R7989 Other specified abnormal findings of blood chemistry: Secondary | ICD-10-CM | POA: Diagnosis not present

## 2023-03-05 LAB — CBC WITH DIFFERENTIAL/PLATELET
Abs Immature Granulocytes: 0.03 10*3/uL (ref 0.00–0.07)
Basophils Absolute: 0 10*3/uL (ref 0.0–0.1)
Basophils Relative: 0 %
Eosinophils Absolute: 0 10*3/uL (ref 0.0–0.5)
Eosinophils Relative: 1 %
HCT: 48.2 % — ABNORMAL HIGH (ref 36.0–46.0)
Hemoglobin: 15.1 g/dL — ABNORMAL HIGH (ref 12.0–15.0)
Immature Granulocytes: 0 %
Lymphocytes Relative: 11 %
Lymphs Abs: 0.9 10*3/uL (ref 0.7–4.0)
MCH: 29.4 pg (ref 26.0–34.0)
MCHC: 31.3 g/dL (ref 30.0–36.0)
MCV: 94 fL (ref 80.0–100.0)
Monocytes Absolute: 0.3 10*3/uL (ref 0.1–1.0)
Monocytes Relative: 4 %
Neutro Abs: 7 10*3/uL (ref 1.7–7.7)
Neutrophils Relative %: 84 %
Platelets: 232 10*3/uL (ref 150–400)
RBC: 5.13 MIL/uL — ABNORMAL HIGH (ref 3.87–5.11)
RDW: 13.4 % (ref 11.5–15.5)
WBC: 8.3 10*3/uL (ref 4.0–10.5)
nRBC: 0 % (ref 0.0–0.2)

## 2023-03-05 LAB — URINALYSIS, ROUTINE W REFLEX MICROSCOPIC
Bacteria, UA: NONE SEEN
Bilirubin Urine: NEGATIVE
Glucose, UA: NEGATIVE mg/dL
Hgb urine dipstick: NEGATIVE
Ketones, ur: NEGATIVE mg/dL
Leukocytes,Ua: NEGATIVE
Nitrite: NEGATIVE
Protein, ur: 100 mg/dL — AB
Specific Gravity, Urine: 1.01 (ref 1.005–1.030)
pH: 6 (ref 5.0–8.0)

## 2023-03-05 LAB — TROPONIN I (HIGH SENSITIVITY)
Troponin I (High Sensitivity): 79 ng/L — ABNORMAL HIGH (ref <18)
Troponin I (High Sensitivity): 228 ng/L (ref <18)

## 2023-03-05 LAB — COMPREHENSIVE METABOLIC PANEL
ALT: 22 U/L (ref 0–44)
AST: 25 U/L (ref 15–41)
Albumin: 4.7 g/dL (ref 3.5–5.0)
Alkaline Phosphatase: 97 U/L (ref 38–126)
Anion gap: 11 (ref 5–15)
BUN: 28 mg/dL — ABNORMAL HIGH (ref 8–23)
CO2: 28 mmol/L (ref 22–32)
Calcium: 9.6 mg/dL (ref 8.9–10.3)
Chloride: 101 mmol/L (ref 98–111)
Creatinine, Ser: 0.89 mg/dL (ref 0.44–1.00)
GFR, Estimated: >60 mL/min (ref >60)
Glucose, Bld: 142 mg/dL — ABNORMAL HIGH (ref 70–99)
Potassium: 4.1 mmol/L (ref 3.5–5.1)
Sodium: 140 mmol/L (ref 135–145)
Total Bilirubin: 0.5 mg/dL (ref 0.0–1.2)
Total Protein: 7.7 g/dL (ref 6.5–8.1)

## 2023-03-05 LAB — BRAIN NATRIURETIC PEPTIDE: B Natriuretic Peptide: 200.4 pg/mL — ABNORMAL HIGH (ref 0.0–100.0)

## 2023-03-05 MED ORDER — ACETAMINOPHEN 160 MG/5ML PO SOLN: 650.0000 mg | ORAL | Status: DC | PRN

## 2023-03-05 MED ORDER — CLOPIDOGREL BISULFATE 75 MG PO TABS
75.0000 mg | ORAL_TABLET | Freq: Every day | ORAL | Status: DC
  Administered 2023-03-05: 75 mg via ORAL
  Filled 2023-03-05: qty 1

## 2023-03-05 MED ORDER — LEVOTHYROXINE SODIUM 88 MCG PO TABS
88.0000 ug | ORAL_TABLET | Freq: Every day | ORAL | Status: DC
  Administered 2023-03-06: 88 ug via ORAL
  Filled 2023-03-05: qty 1

## 2023-03-05 MED ORDER — ONDANSETRON HCL 4 MG/2ML IJ SOLN: 4.0000 mg | Freq: Four times a day (QID) | INTRAMUSCULAR | Status: DC | PRN

## 2023-03-05 MED ORDER — GABAPENTIN 300 MG PO CAPS
600.0000 mg | ORAL_CAPSULE | Freq: Every day | ORAL | Status: DC
  Administered 2023-03-05: 600 mg via ORAL
  Filled 2023-03-05: qty 2

## 2023-03-05 MED ORDER — GABAPENTIN 300 MG PO CAPS: 300.0000 mg | ORAL_CAPSULE | Freq: Two times a day (BID) | ORAL | Status: DC

## 2023-03-05 MED ORDER — ACETAMINOPHEN 325 MG PO TABS: 650.0000 mg | ORAL_TABLET | ORAL | Status: DC | PRN

## 2023-03-05 MED ORDER — ENOXAPARIN SODIUM 40 MG/0.4ML IJ SOSY
40.0000 mg | PREFILLED_SYRINGE | INTRAMUSCULAR | Status: DC
  Administered 2023-03-05: 40 mg via SUBCUTANEOUS
  Filled 2023-03-05: qty 0.4

## 2023-03-05 MED ORDER — GABAPENTIN 300 MG PO CAPS: 300.0000 mg | ORAL_CAPSULE | Freq: Every day | ORAL | Status: DC

## 2023-03-05 MED ORDER — ATORVASTATIN CALCIUM 20 MG PO TABS
20.0000 mg | ORAL_TABLET | Freq: Every day | ORAL | Status: DC
  Administered 2023-03-05: 20 mg via ORAL
  Filled 2023-03-05: qty 1

## 2023-03-05 MED ORDER — ESCITALOPRAM OXALATE 10 MG PO TABS
20.0000 mg | ORAL_TABLET | Freq: Every day | ORAL | Status: DC
  Administered 2023-03-05: 20 mg via ORAL
  Filled 2023-03-05: qty 2

## 2023-03-05 MED ORDER — STROKE: EARLY STAGES OF RECOVERY BOOK: Freq: Once | Status: DC

## 2023-03-05 MED ORDER — IOHEXOL 350 MG/ML SOLN
100.0000 mL | Freq: Once | INTRAVENOUS | Status: AC | PRN
  Administered 2023-03-05: 75 mL via INTRAVENOUS

## 2023-03-05 MED ORDER — ACETAMINOPHEN 325 MG RE SUPP: 650.0000 mg | RECTAL | Status: DC | PRN

## 2023-03-05 MED ORDER — ASPIRIN 81 MG PO TBEC
81.0000 mg | DELAYED_RELEASE_TABLET | Freq: Every day | ORAL | Status: DC
  Administered 2023-03-05: 81 mg via ORAL
  Filled 2023-03-05: qty 1

## 2023-03-05 NOTE — Assessment & Plan Note (Addendum)
 Holding antihypertensives for permissive hypertension Patient noted to be on multiple antihypertensives Will check orthostatics

## 2023-03-05 NOTE — Assessment & Plan Note (Addendum)
 Clinically euvolemic EF 55-60%, G1DD 05/2021 Holding spironolactone, ramipril, isosorbide, hydralazine, Lasix and amlodipine

## 2023-03-05 NOTE — ED Notes (Signed)
 Anner Crete, MD, made aware of troponin 228

## 2023-03-05 NOTE — Assessment & Plan Note (Addendum)
 No acute issues suspected Will request pacemaker check

## 2023-03-05 NOTE — ED Triage Notes (Signed)
Refer to First Nurse Note

## 2023-03-05 NOTE — Hospital Course (Signed)
 Marland Kitchen

## 2023-03-05 NOTE — ED Notes (Signed)
 Pt given meal tray per request. Pending inpt bed.

## 2023-03-05 NOTE — ED Triage Notes (Addendum)
 First nurse note: pt to ED [facility] from home for dizziness started this am. +nausea. Worsening with movement. Tried meclizine this am, had emesis after.  18 R wrist. 4 mg zofran PTA

## 2023-03-05 NOTE — ED Notes (Signed)
 Pt assisted to the bathroom, provider at bedside for eval. Pt denies further needs, pending inpt bed.

## 2023-03-05 NOTE — ED Provider Notes (Signed)
 [facility] Provider Note    Event Date/Time   First MD Initiated Contact with Patient 03/05/23 1547     (approximate)   History   Dizziness   HPI Bonnie Henson is a 89 y.o. female with history of HTN, HLD, DM2, CHF presenting today for dizziness.  Patient states she woke up around 3 AM and has had severe dizziness symptoms.  Does have a history of vertigo but states the symptoms are severely worse than her baseline.  She has had difficulty and been unable to ambulate since this morning.  Symptoms were not present yesterday.  Otherwise denies numbness, weakness, chest pain, shortness of breath, diaphoresis, abdominal pain, diarrhea.  Has had nausea and vomiting associated with the dizziness symptoms.  Tried to take meclizine without improvement in her symptoms.     Physical Exam   Triage Vital Signs: ED Triage Vitals  Encounter Vitals Group     BP 03/05/23 1318 (!) 141/71     Systolic BP Percentile --      Diastolic BP Percentile --      Pulse Rate 03/05/23 1318 65     Resp 03/05/23 1318 18     Temp 03/05/23 1318 97.6 F (36.4 C)     Temp Source 03/05/23 1318 Oral     SpO2 03/05/23 1318 (!) 88 %     Weight 03/05/23 1323 180 lb (81.6 kg)     Height 03/05/23 1323 5' 4 (1.626 m)     Head Circumference --      Peak Flow --      Pain Score 03/05/23 1319 0     Pain Loc --      Pain Education --      Exclude from Growth Chart --     Most recent vital signs: Vitals:   03/05/23 1600 03/05/23 1730  BP: 128/62 138/61  Pulse: 72 60  Resp: 20 19  Temp:    SpO2: 100% 99%   Physical Exam: I have reviewed the vital signs and nursing notes. General: Awake, alert, no acute distress.  Nontoxic appearing. Head:  Atraumatic, normocephalic.   ENT:  EOM intact, PERRL. Oral mucosa is pink and moist with no lesions. Neck: Neck is supple with full range of motion, No meningeal signs. Cardiovascular:  RRR, No murmurs. Peripheral pulses palpable and equal  bilaterally. Respiratory:  Symmetrical chest wall expansion.  No rhonchi, rales, or wheezes.  Good air movement throughout.  No use of accessory muscles.   Musculoskeletal:  No cyanosis or edema. Moving extremities with full ROM Abdomen:  Soft, nontender, nondistended. Neuro:  GCS 15, moving all four extremities, interacting appropriately. Speech clear.  Bilateral nystagmus with extraocular motion.  Cranial nerves II through XII otherwise intact with no obvious abnormalities.  5 out of 5 strength throughout bilateral upper and lower extremities.  Sensation equal and intact throughout bilateral upper and lower extremities. Psych:  Calm, appropriate.   Skin:  Warm, dry, no rash.    ED Results / Procedures / Treatments   Labs (all labs ordered are listed, but only abnormal results are displayed) Labs Reviewed  COMPREHENSIVE METABOLIC PANEL - Abnormal; Notable for the following components:      Result Value   Glucose, Bld 142 (*)    BUN 28 (*)    All other components within normal limits  CBC WITH DIFFERENTIAL/PLATELET - Abnormal; Notable for the following components:   RBC 5.13 (*)    Hemoglobin 15.1 (*)  HCT 48.2 (*)    All other components within normal limits  URINALYSIS, ROUTINE W REFLEX MICROSCOPIC - Abnormal; Notable for the following components:   Color, Urine STRAW (*)    APPearance CLEAR (*)    Protein, ur 100 (*)    All other components within normal limits  BRAIN NATRIURETIC PEPTIDE - Abnormal; Notable for the following components:   B Natriuretic Peptide 200.4 (*)    All other components within normal limits  TROPONIN I (HIGH SENSITIVITY) - Abnormal; Notable for the following components:   Troponin I (High Sensitivity) 79 (*)    All other components within normal limits  TROPONIN I (HIGH SENSITIVITY) - Abnormal; Notable for the following components:   Troponin I (High Sensitivity) 228 (*)    All other components within normal limits     EKG My EKG interpretation:  Rate of 65, left bundle branch block.  Normal axis, normal intervals.  No acute ST elevations or depressions.   RADIOLOGY Independently interpreted chest x-ray, CT head, and CTA chest with no acute pathology   PROCEDURES:  Critical Care performed: Yes, see critical care procedure note(s)  .Critical Care  Performed by: Malvina Alm DASEN, MD Authorized by: Malvina Alm DASEN, MD   Critical care provider statement:    Critical care time (minutes):  30   Critical care was time spent personally by me on the following activities:  Development of treatment plan with patient or surrogate, discussions with consultants, evaluation of patient's response to treatment, examination of patient, ordering and review of laboratory studies, ordering and review of radiographic studies, ordering and performing treatments and interventions, pulse oximetry, re-evaluation of patient's condition and review of old charts    MEDICATIONS ORDERED IN ED: Medications  iohexol  (OMNIPAQUE ) 350 MG/ML injection 100 mL (75 mLs Intravenous Contrast Given 03/05/23 1826)     IMPRESSION / MDM / ASSESSMENT AND PLAN / ED COURSE  I reviewed the triage vital signs and the nursing notes.                              Differential diagnosis includes, but is not limited to, vertigo, lower suspicion CVA, BPPV, vestibular neuritis,, NSTEMI, CHF, pneumonia, PE  Patient's presentation is most consistent with acute presentation with potential threat to life or bodily function.  Patient is a 89 year old female presenting initially today for vertigo-like symptoms with history of the same.  She appeared to have worsening vertigo than her baseline which did not resolve with meclizine.  She does state that is improved at this time but not completely gone.  Separately, she was found to have uptrending troponins initially in the 70s with repeat at 228.  Elevated BNP.  Denying any chest pain or shortness of breath symptoms so more concern for  possible CHF exacerbation with chest x-ray showing trace pleural effusions and slight pitting edema as a cause of her troponin elevation.  CTA chest was ordered and showed no evidence of a PE.  CT head negative.  I also discussed the case with neurology on-call regarding potential for MRI to rule out acute CVA.  He stated that given she only has horizontal beating nystagmus exacerbated with movement and no other neurological symptoms accompanying it, would recommend treating vertigo symptoms before potential MRI.  Patient does have a pacemaker and would need to be transferred if she required MRI.  He felt she was safe to stay here for ongoing chest pain workup.  Patient admitted to hospitalist for further care.  The patient is on the cardiac monitor to evaluate for evidence of arrhythmia and/or significant heart rate changes. Clinical Course as of 03/05/23 1955  Mon Mar 05, 2023  1734 Spoke with MRI department.  Patient has a pacemaker which they are unable to perform MRI here.  She would have to be transferred to [facility] if the MRI needs to be completed. [DW]  8251 Patient reports waking up at 3 AM this morning with no dizziness symptoms.  Then when she woke back up at 8 AM dizziness was present. [DW]  1854 CT Angio Chest PE W and/or Wo Contrast No PE [DW]  1855 Neurology - if it is solo vertigo like symptoms. No reason for immediate transfer for MRI. Can admit for heart issues and consult neurology as needed [DW]    Clinical Course User Index [DW] Malvina Alm DASEN, MD     FINAL CLINICAL IMPRESSION(S) / ED DIAGNOSES   Final diagnoses:  Dizziness  NSTEMI (non-ST elevated myocardial infarction) (HCC)  Congestive heart failure, unspecified HF chronicity, unspecified heart failure type (HCC)     Rx / DC Orders   ED Discharge Orders     None        Note:  This document was prepared using Dragon voice recognition software and may include unintentional dictation errors.   Malvina Alm DASEN, MD 03/05/23 [facility]

## 2023-03-05 NOTE — ED Notes (Signed)
 Assisted to restroom.

## 2023-03-05 NOTE — Assessment & Plan Note (Addendum)
 History of TIA CT head nonacute Unable to get MRI at [facility] due to presence of pacemaker EDP discussed with neurologist who advised no need for transfer for MRI -Repeat CT head in 24 to 48 hours -Antiemetics -Orthostatic vital signs -Stroke workup as follows Permissive hypertension for first 24-48 hrs post stroke onset: Prn Labetalol IV or Vasotec IV If BP greater than 220/120  Statins for LDL goal less than 70 ASA 81mg  daily, Plavix  75mg  daily x 3 weeks then monotherapy thereafter Telemetry,  Avoid dextrose containing fluids, Maintain euglycemia, euthermia Neuro checks q4 hrs x 24 hrs and then per shift Head of bed 30 degrees Physical therapy/Occupational therapy/Speech therapy if failed dysphagia screen Neurology consult to follow

## 2023-03-05 NOTE — Assessment & Plan Note (Signed)
 Continue levothyroxine

## 2023-03-05 NOTE — H&P (Signed)
 History and Physical    Patient: Bonnie Henson FMW:[id number] DOB: [birth year] DOA: 03/05/2023 DOS: the patient was seen and examined on 03/05/2023 PCP: Calista Bradley, MD  Patient coming from: ALF/ILF  Chief Complaint:  Chief Complaint  Patient presents with   Dizziness    HPI: Bonnie  LELON Henson is a 89 y.o. female with medical history significant for HFpEF( EF 55-60%, G1DD 05/2021), HTN, HLD, OSA(intolerant of CPAP),  symptomatic bradycardia (s/p [employer] dual chamber PPM 06/22/2021), history of atypical chest pain, with normal coronaries on cath 05/2021, followed by [facility], left cervico-occipital neuralgia on occipital nerve block injections by neurology, depression, who presents to the ED for dizziness that woke her from sleep at 3 AM.  She had associated nausea and vomiting and has been unsteady on her feet.  At baseline she ambulates with a cane.  She denies changes in her speech and denies any weakness numbness or tingling in the extremities beyond her baseline for her cervical neuralgia.  She does report an area of tingling in the left lateral area of the scalp.  Denies visual disturbance. ED course and data review: On arrival O2 sat was 88% on room air with otherwise normal vitals.  She was placed on O2 at 2 L via nasal cannula Workup notable for the following: Troponin 79--228, BNP 200 CBC notable for hemoglobin of 15 otherwise WNL CMP unremarkable Urinalysis unremarkable EKG, personally viewed and interpreted showing NSR at 65 with slightly widened QRS which does not appear new CTA chest negative for PE and other acute intrathoracic process.  Does show ascending thoracic aorta of 4.1 cm CT head nonacute  Patient was noted to have horizontal nystagmus on EDP exam with no other focal neurologic deficits. Patient was unable to get MRI due to presence of pacemaker The ED provider spoke with neurologist Dr. Merrianne who advised okay to admit to [facility], low concern for stroke given  presence of only horizontal nystagmus. Hospitalist consulted for admission.   Review of Systems: As mentioned in the history of present illness. All other systems reviewed and are negative.  Past Medical History:  Diagnosis Date   Diabetes mellitus without complication (HCC)    Hyperlipidemia    Hypertension    Neuropathy    Sleep apnea    Thyroid  disease    Past Surgical History:  Procedure Laterality Date   ABDOMINAL HYSTERECTOMY     BREAST SURGERY     FRACTURE SURGERY     TONSILLECTOMY     Social History:  reports that she has never smoked. She has never used smokeless tobacco. She reports that she does not drink alcohol and does not use drugs.  Allergies  Allergen Reactions   Rosuvastatin  Other (See Comments)    Muscle pain    Family History  Problem Relation Age of Onset   Stroke Mother    Cancer Father    Cancer Brother     Prior to Admission medications   Medication Sig Start Date End Date Taking? Authorizing Provider  amLODipine  (NORVASC ) 5 MG tablet Take 5 mg by mouth in the morning and at bedtime.   Yes [provider]  aspirin  EC 81 MG tablet Take 81 mg by mouth daily.   Yes [provider]  atorvastatin  (LIPITOR) 20 MG tablet Take 20 mg by mouth daily. 05/25/21  Yes [provider]  escitalopram  (LEXAPRO ) 20 MG tablet Take 20 mg by mouth daily. 06/08/21  Yes [provider]  EUTHYROX  88 MCG tablet Take  88 mcg by mouth every morning. 02/25/21  Yes [provider]  furosemide (LASIX) 20 MG tablet Take 1 tablet by mouth daily as needed. 05/04/21  Yes [provider]  gabapentin  (NEURONTIN ) 300 MG capsule Take 300-600 mg by mouth 2 (two) times daily. 300 mg qam 600 mg qpm   Yes [provider]  Glucosamine-Chondroit-Vit C-Mn (GLUCOSAMINE 1500 COMPLEX PO) Take 1 tablet by mouth daily. 09/19/07  Yes [provider]  isosorbide  mononitrate (IMDUR ) 60 MG 24 hr tablet Take 1.5 tablets (90 mg total) by  mouth daily. 06/19/21 03/05/23 Yes Sreenath, Sudheer B, MD  nitroGLYCERIN  (NITROSTAT ) 0.6 MG SL tablet Place 0.6 mg under the tongue every 5 (five) minutes as needed for chest pain.   Yes [provider]  ramipril  (ALTACE ) 10 MG capsule Take 10 mg by mouth 2 (two) times daily.   Yes [provider]  hydrALAZINE  (APRESOLINE ) 25 MG tablet Take 1 tablet (25 mg total) by mouth 3 (three) times daily. 06/19/21 07/19/21  [name], MD  spironolactone  (ALDACTONE ) 25 MG tablet Take 25 mg by mouth daily. Patient not taking: Reported on 03/05/2023 03/16/21   [provider]  traZODone  (DESYREL ) 50 MG tablet Take 50 mg by mouth at bedtime. Patient not taking: Reported on 03/05/2023    [provider]    Physical Exam: Vitals:   03/05/23 1323 03/05/23 1600 03/05/23 1730 03/05/23 2029  BP:  128/62 138/61   Pulse:  72 60   Resp:  20 19   Temp:    (!) 97.5 F (36.4 C)  TempSrc:    Oral  SpO2:  100% 99%   Weight: 81.6 kg     Height: 5' 4 (1.626 m)      Physical Exam Vitals and nursing note reviewed.  Constitutional:      General: She is not in acute distress. HENT:     Head: Normocephalic and atraumatic.  Cardiovascular:     Rate and Rhythm: Normal rate and regular rhythm.     Heart sounds: Normal heart sounds.  Pulmonary:     Effort: Pulmonary effort is normal.     Breath sounds: Normal breath sounds.  Abdominal:     Palpations: Abdomen is soft.     Tenderness: There is no abdominal tenderness.  Neurological:     General: No focal deficit present.     Mental Status: Mental status is at baseline.     Labs on Admission: I have personally reviewed following labs and imaging studies  CBC: Recent Labs  Lab 03/05/23 1331  WBC 8.3  NEUTROABS 7.0  HGB 15.1*  HCT 48.2*  MCV 94.0  PLT 232   Basic Metabolic Panel: Recent Labs  Lab 03/05/23 1331  NA 140  K 4.1  CL 101  CO2 28  GLUCOSE 142*  BUN 28*  CREATININE 0.89  CALCIUM  9.6    GFR: Estimated Creatinine Clearance: 42.6 mL/min (by C-G formula based on SCr of 0.89 mg/dL). Liver Function Tests: Recent Labs  Lab 03/05/23 1331  AST 25  ALT 22  ALKPHOS 97  BILITOT 0.5  PROT 7.7  ALBUMIN 4.7   No results for input(s): LIPASE, AMYLASE in the last 168 hours. No results for input(s): AMMONIA in the last 168 hours. Coagulation Profile: No results for input(s): INR, PROTIME in the last 168 hours. Cardiac Enzymes: No results for input(s): CKTOTAL, CKMB, CKMBINDEX, TROPONINI in the last 168 hours. BNP (last 3 results) No results for input(s): PROBNP in the  last 8760 hours. HbA1C: No results for input(s): HGBA1C in the last 72 hours. CBG: No results for input(s): GLUCAP in the last 168 hours. Lipid Profile: No results for input(s): CHOL, HDL, LDLCALC, TRIG, CHOLHDL, LDLDIRECT in the last 72 hours. Thyroid  Function Tests: No results for input(s): TSH, T4TOTAL, FREET4, T3FREE, THYROIDAB in the last 72 hours. Anemia Panel: No results for input(s): VITAMINB12, FOLATE, FERRITIN, TIBC, IRON, RETICCTPCT in the last 72 hours. Urine analysis:    Component Value Date/Time   COLORURINE STRAW (A) 03/05/2023 1323   APPEARANCEUR CLEAR (A) 03/05/2023 1323   LABSPEC 1.010 03/05/2023 1323   PHURINE 6.0 03/05/2023 1323   GLUCOSEU NEGATIVE 03/05/2023 1323   HGBUR NEGATIVE 03/05/2023 1323   BILIRUBINUR NEGATIVE 03/05/2023 1323   KETONESUR NEGATIVE 03/05/2023 1323   PROTEINUR 100 (A) 03/05/2023 1323   NITRITE NEGATIVE 03/05/2023 1323   LEUKOCYTESUR NEGATIVE 03/05/2023 1323    Radiological Exams on Admission: CT Angio Chest PE W and/or Wo Contrast Result Date: 03/05/2023 CLINICAL DATA:  Acute onset dizziness with nausea EXAM: CT ANGIOGRAPHY CHEST WITH CONTRAST TECHNIQUE: Multidetector CT imaging of the chest was performed using the standard protocol during bolus administration of intravenous contrast. Multiplanar CT  image reconstructions and MIPs were obtained to evaluate the vascular anatomy. RADIATION DOSE REDUCTION: This exam was performed according to the departmental dose-optimization program which includes automated exposure control, adjustment of the mA and/or kV according to patient size and/or use of iterative reconstruction technique. CONTRAST:  75mL OMNIPAQUE  IOHEXOL  350 MG/ML SOLN COMPARISON:  Same day chest radiograph FINDINGS: Cardiovascular: Left chest wall pacemaker terminate in the right atrium and ventricle. The study is adequate for the evaluation of pulmonary embolism. There is heterogeneous enhancement of subsegmental pulmonary artery branches due to beam hardening artifact and motion. There are no filling defects in the central, lobar, segmental or proximal subsegmental pulmonary artery branches to suggest acute pulmonary embolism. Ascending thoracic aorta measures 4.1 x 4.0 cm. Multichamber cardiomegaly. No significant pericardial fluid/thickening. Coronary artery calcifications and aortic atherosclerosis. Mediastinum/Nodes: Imaged thyroid  gland without nodules meeting criteria for imaging follow-up by size. Small hiatal hernia. No pathologically enlarged axillary, supraclavicular, mediastinal, or hilar lymph nodes. Lungs/Pleura: The central airways are patent. Mild diffuse bronchial wall thickening. Scattered subpleural ground-glass opacities in the bilateral lower lobes, likely atelectasis. No focal consolidation. No pneumothorax. No pleural effusion. Upper abdomen: Normal. Musculoskeletal: No acute or abnormal lytic or blastic osseous lesions. Multilevel degenerative changes of the thoracic spine. Review of the MIP images confirms the above findings. IMPRESSION: 1. No evidence of pulmonary embolism or other acute intrathoracic process. 2. Multichamber cardiomegaly. 3. Ascending thoracic aorta measures 4.1 cm. Recommend annual imaging followup by CTA or MRA. This recommendation follows 2010  ACCF/AHA/AATS/ACR/ASA/SCA/SCAI/SIR/STS/SVM Guidelines for the Diagnosis and Management of Patients with Thoracic Aortic Disease. Circulation. 2010; 121: Z733-z630. Aortic aneurysm NOS (ICD10-I71.9) 4. Aortic Atherosclerosis (ICD10-I70.0). Coronary artery calcifications. Assessment for potential risk factor modification, dietary therapy or pharmacologic therapy may be warranted, if clinically indicated. Electronically Signed   By: Limin  Xu M.D.   On: 03/05/2023 18:51   DG Chest 2 View Result Date: 03/05/2023 CLINICAL DATA:  Shortness of breath.  CHF. EXAM: CHEST - 2 VIEW COMPARISON:  06/17/2021 FINDINGS: Lateral view degraded by patient arm position. Pacer with leads at right atrium and right ventricle. No lead discontinuity. Apical lordotic frontal view. Mild cardiomegaly. Tortuous thoracic aorta. No pleural effusion or pneumothorax. No congestive failure. IMPRESSION: Mild cardiomegaly, without congestive failure or acute disease. Electronically Signed  By: Rockey Kilts M.D.   On: 03/05/2023 17:58   CT Head Wo Contrast Result Date: 03/05/2023 CLINICAL DATA:  Syncope/presyncope, cerebrovascular cause suspected EXAM: CT HEAD WITHOUT CONTRAST TECHNIQUE: Contiguous axial images were obtained from the base of the skull through the vertex without intravenous contrast. RADIATION DOSE REDUCTION: This exam was performed according to the departmental dose-optimization program which includes automated exposure control, adjustment of the mA and/or kV according to patient size and/or use of iterative reconstruction technique. COMPARISON:  MRI head December 21, 2004. FINDINGS: Brain: No evidence of acute infarction, hemorrhage, hydrocephalus, extra-axial collection or mass lesion/mass effect. Small remote left basal ganglia infarct. Patchy white matter hypodensities are nonspecific but compatible with chronic microvascular ischemic disease. Vascular: No hyperdense vessel identified. Skull: No acute fracture. Sinuses/Orbits:  Clear sinuses.  No acute orbital findings. Other: No mastoid effusions. IMPRESSION: No evidence of acute intracranial abnormality. Electronically Signed   By: Gilmore GORMAN Molt M.D.   On: 03/05/2023 15:00     Data Reviewed: Relevant notes from primary care and specialist visits, past discharge summaries as available in EHR, including Care Everywhere. Prior diagnostic testing as pertinent to current admission diagnoses Updated medications and problem lists for reconciliation ED course, including vitals, labs, imaging, treatment and response to treatment Triage notes, nursing and pharmacy notes and ED provider's notes Notable results as noted in HPI   Assessment and Plan: * Vertigo History of TIA CT head nonacute Unable to get MRI at [facility] due to presence of pacemaker EDP discussed with neurologist who advised no need for transfer for MRI -Repeat CT head in 24 to 48 hours -Antiemetics -Orthostatic vital signs -Stroke workup as follows Permissive hypertension for first 24-48 hrs post stroke onset: Prn Labetalol IV or Vasotec IV If BP greater than 220/120  Statins for LDL goal less than 70 ASA 81mg  daily, Plavix  75mg  daily x 3 weeks then monotherapy thereafter Telemetry,  Avoid dextrose containing fluids, Maintain euglycemia, euthermia Neuro checks q4 hrs x 24 hrs and then per shift Head of bed 30 degrees Physical therapy/Occupational therapy/Speech therapy if failed dysphagia screen Neurology consult to follow   Elevated troponin History of normal coronaries on cath 05/2021 Suspect demand ischemia related to vertigo with vomiting Will trend to peak Patient has no chest pain Cardiology consulted.  Hypertension Holding antihypertensives for permissive hypertension Patient noted to be on multiple antihypertensives Will check orthostatics  Symptomatic bradycardia s/p pacemaker No acute issues suspected Will request pacemaker check  Chronic heart failure with preserved  ejection fraction (HCC) Clinically euvolemic EF 55-60%, G1DD 05/2021 Holding spironolactone , ramipril , isosorbide , hydralazine , Lasix and amlodipine   Acquired hypothyroidism Continue levothyroxine   Cervico-occipital neuralgia, left Receives nerve block injections monthly Continue gabapentin         DVT prophylaxis: Lovenox   Consults: neurology, cardiology  Advance Care Planning:   Code Status: Prior   Family Communication: none  Disposition Plan: Back to previous home environment  Severity of Illness: The appropriate patient status for this patient is OBSERVATION. Observation status is judged to be reasonable and necessary in order to provide the required intensity of service to ensure the patient's safety. The patient's presenting symptoms, physical exam findings, and initial radiographic and laboratory data in the context of their medical condition is felt to place them at decreased risk for further clinical deterioration. Furthermore, it is anticipated that the patient will be medically stable for discharge from the hospital within 2 midnights of admission.   Author: Delayne LULLA Solian, MD 03/05/2023 9:02 PM  For  on call review [email or web address].

## 2023-03-05 NOTE — Assessment & Plan Note (Addendum)
 History of normal coronaries on cath 05/2021 Suspect demand ischemia related to vertigo with vomiting Will trend to peak Patient has no chest pain Cardiology consulted.

## 2023-03-05 NOTE — Assessment & Plan Note (Addendum)
 Receives nerve block injections monthly Continue gabapentin

## 2023-03-05 NOTE — ED Provider Triage Note (Signed)
 Emergency Medicine Provider Triage Evaluation Note  Georganna  PRISTINE GLADHILL , a 89 y.o. female  was evaluated in triage.  Pt complains of dizziness upon awakening this morning at 8am. Feels like the room is spinning. History of vertigo. Reports feeling dizzy to the point of nearly falling this morning. Denies syncope.  Physical Exam  There were no vitals taken for this visit. Gen:   Awake, alert, oriented. No distress   Resp:  Normal effort  MSK:   Moves extremities without difficulty  Other:    Medical Decision Making  Medically screening exam initiated at 1:18 PM.  Appropriate orders placed.  Melyssa  W Koone was informed that the remainder of the evaluation will be completed by another provider, this initial triage assessment does not replace that evaluation, and the importance of remaining in the ED until their evaluation is complete.  Oxygen 88% on room air. No previous oxygen requirements. Oxygen 2 liters with immediate improvement.   Herlinda Kirk NOVAK, FNP 03/05/23 1327

## 2023-03-06 ENCOUNTER — Observation Stay: Payer: Medicare Other

## 2023-03-06 ENCOUNTER — Observation Stay
Admit: 2023-03-06 | Discharge: 2023-03-06 | Disposition: A | Discharge status: Home / Self Care | Payer: Medicare Other | Attending: Internal Medicine

## 2023-03-06 DIAGNOSIS — R42 Dizziness and giddiness: Secondary | ICD-10-CM | POA: Diagnosis not present

## 2023-03-06 LAB — LIPID PANEL
Cholesterol: 147 mg/dL (ref 0–200)
HDL: 46 mg/dL (ref >40)
LDL Cholesterol: 81 mg/dL (ref 0–99)
Total CHOL/HDL Ratio: 3.2 {ratio}
Triglycerides: 98 mg/dL (ref <150)
VLDL: 20 mg/dL (ref 0–40)

## 2023-03-06 LAB — ECHOCARDIOGRAM COMPLETE
AR max vel: 3.47 cm2
AV Area VTI: 3.9 cm2
AV Area mean vel: 3.69 cm2
AV Mean grad: 3.5 mm[Hg]
AV Peak grad: 6.9 mm[Hg]
Ao pk vel: 1.32 m/s
Area-P 1/2: 3.12 cm2
Height: 64 in
MV VTI: 4.4 cm2
S' Lateral: 3.1 cm
Weight: 2880 [oz_av]

## 2023-03-06 NOTE — ED Notes (Signed)
 Went to assess patient in the hallway. AOX4. Patient states her nephew is coming to pick her up. Reused to take medications patient states "She is ready to go home." RN to obtain discharge papers at this time.

## 2023-03-06 NOTE — ED Notes (Signed)
Handoff given to oncoming RN.

## 2023-03-06 NOTE — Discharge Summary (Addendum)
 Millena  CAPRINA WUSSOW FMW:[id number] DOB: Jun 04, 1929 DOA: 03/05/2023  PCP: Calista Bradley, MD  Admit date: 03/05/2023 Discharge date: 03/06/2023  Time spent: 35 minutes  Recommendations for Outpatient Follow-up:  Pcp f/u Cardiology f/u     Discharge Diagnoses:  Principal Problem:   Vertigo Active Problems:   Personal history of transient cerebral ischemia   Elevated troponin   Hypertension   Sleep apnea   Cervico-occipital neuralgia, left   Acquired hypothyroidism   Chronic heart failure with preserved ejection fraction (HCC)   Symptomatic bradycardia s/p pacemaker   Status cardiac pacemaker   Essential hypertension   Discharge Condition: improved  Diet recommendation: heart healthy  Filed Weights   03/05/23 1323  Weight: 81.6 kg    History of present illness:  Najmo  W Flaim is a 89 y.o. female with medical history significant for HFpEF( EF 55-60%, G1DD 05/2021), HTN, HLD, OSA(intolerant of CPAP),  symptomatic bradycardia (s/p [employer] dual chamber PPM 06/22/2021), history of atypical chest pain, with normal coronaries on cath 05/2021, followed by [facility], left cervico-occipital neuralgia on occipital nerve block injections by neurology, depression, who presents to the ED for dizziness that woke her from sleep at 3 AM.  She had associated nausea and vomiting and has been unsteady on her feet.  At baseline she ambulates with a cane.  She denies changes in her speech and denies any weakness numbness or tingling in the extremities beyond her baseline for her cervical neuralgia.  She does report an area of tingling in the left lateral area of the scalp.  Denies visual disturbance.   Hospital Course:  Patient with known history of bppv presents after experiencing vertigo in the middle of the night after she woke up and rolled over. She was evaluated with CT head (nothing acute), CTA of chest (no PE or other acute process), carotid dopplers (no hemodynamically significant stenosis), tte  (stable from priors). Did have mild troponin elevation, no chest pain or overt ischemic changes on EKG, cardiology evaluated and this deemed to be demand ischemia, apparently had normal heart cath at [facility] last year. Discussed case with neurology, this appears to be an exacerbation of patient's known bppv. Her symptoms are now resolved and she ambulates without difficulty, stable for discharge.   Procedures: none   Consultations: cardiology  Discharge Exam: Vitals:   03/06/23 0818 03/06/23 1030  BP: (!) 136/54 134/75  Pulse: 60 (!) 57  Resp: 15 14  Temp:    SpO2: 99% 97%    General: NAD Cardiovascular: RRR, soft systolic murmur Respiratory: rales at bases otherwise normal Neuro: cn 2-12 grossly intact  Discharge Instructions   Discharge Instructions     Diet - low sodium heart healthy   Complete by: As directed    Increase activity slowly   Complete by: As directed       Allergies as of 03/06/2023       Reactions   Rosuvastatin  Other (See Comments)   Muscle pain        Medication List     STOP taking these medications    spironolactone  25 MG tablet Commonly known as: ALDACTONE    traZODone  50 MG tablet Commonly known as: DESYREL        TAKE these medications    amLODipine  5 MG tablet Commonly known as: NORVASC  Take 5 mg by mouth in the morning and at bedtime.   aspirin  EC 81 MG tablet Take 81 mg by mouth daily.   atorvastatin  20 MG tablet Commonly known as: LIPITOR  Take 20 mg by mouth daily.   escitalopram  20 MG tablet Commonly known as: LEXAPRO  Take 20 mg by mouth daily.   Euthyrox  88 MCG tablet Generic drug: levothyroxine  Take 88 mcg by mouth every morning.   furosemide 20 MG tablet Commonly known as: LASIX Take 1 tablet by mouth daily as needed.   gabapentin  300 MG capsule Commonly known as: NEURONTIN  Take 300-600 mg by mouth 2 (two) times daily. 300 mg qam 600 mg qpm   GLUCOSAMINE 1500 COMPLEX PO Take 1 tablet by mouth daily.    hydrALAZINE  25 MG tablet Commonly known as: APRESOLINE  Take 1 tablet (25 mg total) by mouth 3 (three) times daily.   isosorbide  mononitrate 60 MG 24 hr tablet Commonly known as: IMDUR  Take 1.5 tablets (90 mg total) by mouth daily.   nitroGLYCERIN  0.6 MG SL tablet Commonly known as: NITROSTAT  Place 0.6 mg under the tongue every 5 (five) minutes as needed for chest pain.   ramipril  10 MG capsule Commonly known as: ALTACE  Take 10 mg by mouth 2 (two) times daily.       Allergies  Allergen Reactions   Rosuvastatin  Other (See Comments)    Muscle pain    Follow-up Information     Lorren Dasie Sieving, PA. Go in 1 week(s).   Specialty: Cardiology Contact information: [address] [place] [address] [phone]         Calista Bradley, MD Follow up.   Specialty: Family Medicine Contact information: [address] [place] [address] [phone]                  The results of significant diagnostics from this hospitalization (including imaging, microbiology, ancillary and laboratory) are listed below for reference.    Significant Diagnostic Studies: US  Carotid Bilateral (at [facility] and [facility] only) Addendum Date: 03/06/2023 ADDENDUM REPORT: 03/06/2023 13:17 CLINICAL DATA:  Stroke EXAM: BILATERAL CAROTID DUPLEX ULTRASOUND TECHNIQUE: Elnor scale imaging, color Doppler and duplex ultrasound were performed of bilateral carotid and vertebral arteries in the neck. COMPARISON:  None Available. FINDINGS: Criteria: Quantification of carotid stenosis is based on velocity parameters that correlate the residual internal carotid diameter with NASCET-based stenosis levels, using the diameter of the distal internal carotid lumen as the denominator for stenosis measurement. The following velocity measurements were obtained: RIGHT ICA: 110 cm/sec CCA: 55 cm/sec SYSTOLIC ICA/CCA RATIO:  2.0 ECA: 75 cm/sec LEFT ICA: 112 cm/sec CCA: 71 cm/sec SYSTOLIC ICA/CCA RATIO:   1.6 ECA: 74 cm/sec RIGHT CAROTID ARTERY: Mild intimal thickening and plaque. RIGHT VERTEBRAL ARTERY:  Antegrade flow LEFT CAROTID ARTERY:  Mild intimal thickening and plaque. LEFT VERTEBRAL ARTERY:  Antegrade flow IMPRESSION: Mild intimal thickening with some scattered plaque bilaterally. Vessel tortuosity. No hemodynamic significant stenosis seen of either ICA. Electronically Signed   By: Ranell Bring M.D.   On: 03/06/2023 13:17   Result Date: 03/06/2023 CLINICAL DATA:  Stroke EXAM: BILATERAL CAROTID DUPLEX ULTRASOUND TECHNIQUE: Elnor scale imaging, color Doppler and duplex ultrasound were performed of bilateral carotid and vertebral arteries in the neck. COMPARISON:  None Available. FINDINGS: Criteria: Quantification of carotid stenosis is based on velocity parameters that correlate the residual internal carotid diameter with NASCET-based stenosis levels, using the diameter of the distal internal carotid lumen as the denominator for stenosis measurement. The following velocity measurements were obtained: RIGHT ICA: 110 cm/sec CCA: 55 cm/sec SYSTOLIC ICA/CCA RATIO:  2.0 ECA: 75 cm/sec LEFT ICA: 112 cm/sec CCA: 71 cm/sec SYSTOLIC ICA/CCA RATIO:  1.6 ECA: 74  cm/sec RIGHT CAROTID ARTERY: Mild intimal thickening and plaque. RIGHT VERTEBRAL ARTERY:  Antegrade flow LEFT CAROTID ARTERY:  Mild intimal thickening and plaque. LEFT VERTEBRAL ARTERY:  Antegrade flow IMPRESSION: Mild intimal thickening with some scattered plaque bilaterally. Vessel tortuosity. Hemodynamic significant stenosis seen of either ICA. Electronically Signed: By: Ranell Bring M.D. On: 03/06/2023 10:50   CT Angio Chest PE W and/or Wo Contrast Result Date: 03/05/2023 CLINICAL DATA:  Acute onset dizziness with nausea EXAM: CT ANGIOGRAPHY CHEST WITH CONTRAST TECHNIQUE: Multidetector CT imaging of the chest was performed using the standard protocol during bolus administration of intravenous contrast. Multiplanar CT image reconstructions and MIPs were  obtained to evaluate the vascular anatomy. RADIATION DOSE REDUCTION: This exam was performed according to the departmental dose-optimization program which includes automated exposure control, adjustment of the mA and/or kV according to patient size and/or use of iterative reconstruction technique. CONTRAST:  75mL OMNIPAQUE  IOHEXOL  350 MG/ML SOLN COMPARISON:  Same day chest radiograph FINDINGS: Cardiovascular: Left chest wall pacemaker terminate in the right atrium and ventricle. The study is adequate for the evaluation of pulmonary embolism. There is heterogeneous enhancement of subsegmental pulmonary artery branches due to beam hardening artifact and motion. There are no filling defects in the central, lobar, segmental or proximal subsegmental pulmonary artery branches to suggest acute pulmonary embolism. Ascending thoracic aorta measures 4.1 x 4.0 cm. Multichamber cardiomegaly. No significant pericardial fluid/thickening. Coronary artery calcifications and aortic atherosclerosis. Mediastinum/Nodes: Imaged thyroid  gland without nodules meeting criteria for imaging follow-up by size. Small hiatal hernia. No pathologically enlarged axillary, supraclavicular, mediastinal, or hilar lymph nodes. Lungs/Pleura: The central airways are patent. Mild diffuse bronchial wall thickening. Scattered subpleural ground-glass opacities in the bilateral lower lobes, likely atelectasis. No focal consolidation. No pneumothorax. No pleural effusion. Upper abdomen: Normal. Musculoskeletal: No acute or abnormal lytic or blastic osseous lesions. Multilevel degenerative changes of the thoracic spine. Review of the MIP images confirms the above findings. IMPRESSION: 1. No evidence of pulmonary embolism or other acute intrathoracic process. 2. Multichamber cardiomegaly. 3. Ascending thoracic aorta measures 4.1 cm. Recommend annual imaging followup by CTA or MRA. This recommendation follows 2010 ACCF/AHA/AATS/ACR/ASA/SCA/SCAI/SIR/STS/SVM  Guidelines for the Diagnosis and Management of Patients with Thoracic Aortic Disease. Circulation. 2010; 121: Z733-z630. Aortic aneurysm NOS (ICD10-I71.9) 4. Aortic Atherosclerosis (ICD10-I70.0). Coronary artery calcifications. Assessment for potential risk factor modification, dietary therapy or pharmacologic therapy may be warranted, if clinically indicated. Electronically Signed   By: Limin  Xu M.D.   On: 03/05/2023 18:51   DG Chest 2 View Result Date: 03/05/2023 CLINICAL DATA:  Shortness of breath.  CHF. EXAM: CHEST - 2 VIEW COMPARISON:  06/17/2021 FINDINGS: Lateral view degraded by patient arm position. Pacer with leads at right atrium and right ventricle. No lead discontinuity. Apical lordotic frontal view. Mild cardiomegaly. Tortuous thoracic aorta. No pleural effusion or pneumothorax. No congestive failure. IMPRESSION: Mild cardiomegaly, without congestive failure or acute disease. Electronically Signed   By: Rockey Kilts M.D.   On: 03/05/2023 17:58   CT Head Wo Contrast Result Date: 03/05/2023 CLINICAL DATA:  Syncope/presyncope, cerebrovascular cause suspected EXAM: CT HEAD WITHOUT CONTRAST TECHNIQUE: Contiguous axial images were obtained from the base of the skull through the vertex without intravenous contrast. RADIATION DOSE REDUCTION: This exam was performed according to the departmental dose-optimization program which includes automated exposure control, adjustment of the mA and/or kV according to patient size and/or use of iterative reconstruction technique. COMPARISON:  MRI head December 21, 2004. FINDINGS: Brain: No evidence of acute infarction, hemorrhage, hydrocephalus, extra-axial  collection or mass lesion/mass effect. Small remote left basal ganglia infarct. Patchy white matter hypodensities are nonspecific but compatible with chronic microvascular ischemic disease. Vascular: No hyperdense vessel identified. Skull: No acute fracture. Sinuses/Orbits: Clear sinuses.  No acute orbital findings.  Other: No mastoid effusions. IMPRESSION: No evidence of acute intracranial abnormality. Electronically Signed   By: Gilmore GORMAN Molt M.D.   On: 03/05/2023 15:00    Microbiology: No results found for this or any previous visit (from the past 240 hours).   Labs: Basic Metabolic Panel: Recent Labs  Lab 03/05/23 1331  NA 140  K 4.1  CL 101  CO2 28  GLUCOSE 142*  BUN 28*  CREATININE 0.89  CALCIUM  9.6   Liver Function Tests: Recent Labs  Lab 03/05/23 1331  AST 25  ALT 22  ALKPHOS 97  BILITOT 0.5  PROT 7.7  ALBUMIN 4.7   No results for input(s): LIPASE, AMYLASE in the last 168 hours. No results for input(s): AMMONIA in the last 168 hours. CBC: Recent Labs  Lab 03/05/23 1331  WBC 8.3  NEUTROABS 7.0  HGB 15.1*  HCT 48.2*  MCV 94.0  PLT 232   Cardiac Enzymes: No results for input(s): CKTOTAL, CKMB, CKMBINDEX, TROPONINI in the last 168 hours. BNP: BNP (last 3 results) Recent Labs    03/05/23 1801  BNP 200.4*    ProBNP (last 3 results) No results for input(s): PROBNP in the last 8760 hours.  CBG: No results for input(s): GLUCAP in the last 168 hours.     Signed:  Devaughn KATHEE Ban MD.  [facility] 03/06/2023, 1:21 PM

## 2023-03-06 NOTE — Consult Note (Addendum)
 [facility] CLINIC CARDIOLOGY CONSULT NOTE       Patient ID: Bonnie  BRILEIGH Henson MRN: [id number] DOB/AGE: [birth year] 89 y.o.  Admit date: 03/05/2023 Referring Physician Dr. Delayne Solian Primary Physician Calista, Slater, MD  Primary Cardiologist Dasie Drones, PA ([facility]) Reason for Consultation elevated troponin  HPI: Bonnie  IVORIE Henson is a 89 y.o. female  with a past medical history of HTN, CKD, HFpEF, OSA, symptomatic bradycardia s/p post dual-chamber PPM  who presented to the ED on 03/05/2023 for dizziness. Troponins checked and found to be elevated. Cardiology was consulted for further evaluation.   Patient states that she woke up during the night on 9/6 with significant dizziness.  States that she has a history of vertigo, with this episode of significant dizziness she also had associated nausea and vomiting and decided to come to the ED for further evaluation.  Workup in the ED notable for creatinine 0.89, potassium 4.1, sodium 140, hemoglobin 15.1, WBC 8.3.  Troponins trended 79 > 228.  BNP 200.  CT head without acute abnormality.  CTA chest without evidence of PE but did demonstrate ascending thoracic aorta measuring 4.1 cm.  EKG demonstrated sinus rhythm, nonischemic.  At the time of my evaluation this morning patient is resting comfortably in ED stretcher.  States that overall she is feeling much better.  She denies any dizziness at the time of my evaluation, also denies any nausea.  States that she has not had any chest pain.  Also denies any palpitations.  States that she has mild intermittent episodes of shortness of breath which has been an ongoing issue for many years.  Denies any acute shortness of breath.  Review of systems complete and found to be negative unless listed above    Past Medical History:  Diagnosis Date   Diabetes mellitus without complication (HCC)    Hyperlipidemia    Hypertension    Neuropathy    Sleep apnea    Thyroid  disease     Past Surgical History:  Procedure  Laterality Date   ABDOMINAL HYSTERECTOMY     BREAST SURGERY     FRACTURE SURGERY     TONSILLECTOMY      (Not in a hospital admission)  Social History   Socioeconomic History   Marital status: Married    Spouse name: Not on file   Number of children: Not on file   Years of education: Not on file   Highest education level: Not on file  Occupational History   Not on file  Tobacco Use   Smoking status: Never   Smokeless tobacco: Never  Vaping Use   Vaping status: Never Used  Substance and Sexual Activity   Alcohol use: No   Drug use: No   Sexual activity: Not on file  Other Topics Concern   Not on file  Social History Narrative   Not on file   Social Drivers of Health   Financial Resource Strain: Low Risk  (09/07/2022)   Received from [facility]   Overall Financial Resource Strain (CARDIA)    Difficulty of Paying Living Expenses: Not hard at all  Food Insecurity: No Food Insecurity (09/07/2022)   Received from [facility]   Hunger Vital Sign    Worried About Running Out of Food in the Last Year: Never true    Ran Out of Food in the Last Year: Never true  Transportation Needs: No Transportation Needs (09/07/2022)   Received from [facility]   PRAPARE -  Transportation    In the past 12 months, has lack of transportation kept you from medical appointments or from getting medications?: No    Lack of Transportation (Non-Medical): No  Physical Activity: Not on file  Stress: Not on file  Social Connections: Not on file  Intimate Partner Violence: Not on file    Family History  Problem Relation Age of Onset   Stroke Mother    Cancer Father    Cancer Brother      Vitals:   03/06/23 0000 03/06/23 0013 03/06/23 0230 03/06/23 0446  BP: 96/61  (!) 116/57 (!) 108/57  Pulse: 90  (!) 59 60  Resp: 17  16 18   Temp:  98.4 F (36.9 C)  97.8 F (36.6 C)  TempSrc:  Oral  Oral  SpO2: 96%  100% 97%  Weight:      Height:         PHYSICAL EXAM General: Well appearing elderly female, well nourished, in no acute distress. HEENT: Normocephalic and atraumatic. Neck: No JVD.  Lungs: Normal respiratory effort on 2L [place]. Clear bilaterally to auscultation. No wheezes, crackles, rhonchi.  Heart: HRRR. Normal S1 and S2 without gallops or murmurs.  Abdomen: Non-distended appearing.  Msk: Normal strength and tone for age. Extremities: Warm and well perfused. No clubbing, cyanosis. No edema.  Neuro: Alert and oriented X 3. Psych: Answers questions appropriately.   Labs: Basic Metabolic Panel: Recent Labs    03/05/23 1331  NA 140  K 4.1  CL 101  CO2 28  GLUCOSE 142*  BUN 28*  CREATININE 0.89  CALCIUM  9.6   Liver Function Tests: Recent Labs    03/05/23 1331  AST 25  ALT 22  ALKPHOS 97  BILITOT 0.5  PROT 7.7  ALBUMIN 4.7   No results for input(s): LIPASE, AMYLASE in the last 72 hours. CBC: Recent Labs    03/05/23 1331  WBC 8.3  NEUTROABS 7.0  HGB 15.1*  HCT 48.2*  MCV 94.0  PLT 232   Cardiac Enzymes: Recent Labs    03/05/23 1331 03/05/23 1608  TROPONINIHS 79* 228*   BNP: Recent Labs    03/05/23 1801  BNP 200.4*   D-Dimer: No results for input(s): DDIMER in the last 72 hours. Hemoglobin A1C: No results for input(s): HGBA1C in the last 72 hours. Fasting Lipid Panel: Recent Labs    03/06/23 0441  CHOL 147  HDL 46  LDLCALC 81  TRIG 98  CHOLHDL 3.2   Thyroid  Function Tests: No results for input(s): TSH, T4TOTAL, T3FREE, THYROIDAB in the last 72 hours.  Invalid input(s): FREET3 Anemia Panel: No results for input(s): VITAMINB12, FOLATE, FERRITIN, TIBC, IRON, RETICCTPCT in the last 72 hours.   Radiology: CT Angio Chest PE W and/or Wo Contrast Result Date: 03/05/2023 CLINICAL DATA:  Acute onset dizziness with nausea EXAM: CT ANGIOGRAPHY CHEST WITH CONTRAST TECHNIQUE: Multidetector CT imaging of the chest was performed using the standard protocol  during bolus administration of intravenous contrast. Multiplanar CT image reconstructions and MIPs were obtained to evaluate the vascular anatomy. RADIATION DOSE REDUCTION: This exam was performed according to the departmental dose-optimization program which includes automated exposure control, adjustment of the mA and/or kV according to patient size and/or use of iterative reconstruction technique. CONTRAST:  75mL OMNIPAQUE  IOHEXOL  350 MG/ML SOLN COMPARISON:  Same day chest radiograph FINDINGS: Cardiovascular: Left chest wall pacemaker terminate in the right atrium and ventricle. The study is adequate for the evaluation of pulmonary embolism. There is heterogeneous enhancement of subsegmental  pulmonary artery branches due to beam hardening artifact and motion. There are no filling defects in the central, lobar, segmental or proximal subsegmental pulmonary artery branches to suggest acute pulmonary embolism. Ascending thoracic aorta measures 4.1 x 4.0 cm. Multichamber cardiomegaly. No significant pericardial fluid/thickening. Coronary artery calcifications and aortic atherosclerosis. Mediastinum/Nodes: Imaged thyroid  gland without nodules meeting criteria for imaging follow-up by size. Small hiatal hernia. No pathologically enlarged axillary, supraclavicular, mediastinal, or hilar lymph nodes. Lungs/Pleura: The central airways are patent. Mild diffuse bronchial wall thickening. Scattered subpleural ground-glass opacities in the bilateral lower lobes, likely atelectasis. No focal consolidation. No pneumothorax. No pleural effusion. Upper abdomen: Normal. Musculoskeletal: No acute or abnormal lytic or blastic osseous lesions. Multilevel degenerative changes of the thoracic spine. Review of the MIP images confirms the above findings. IMPRESSION: 1. No evidence of pulmonary embolism or other acute intrathoracic process. 2. Multichamber cardiomegaly. 3. Ascending thoracic aorta measures 4.1 cm. Recommend annual imaging  followup by CTA or MRA. This recommendation follows 2010 ACCF/AHA/AATS/ACR/ASA/SCA/SCAI/SIR/STS/SVM Guidelines for the Diagnosis and Management of Patients with Thoracic Aortic Disease. Circulation. 2010; 121: Z733-z630. Aortic aneurysm NOS (ICD10-I71.9) 4. Aortic Atherosclerosis (ICD10-I70.0). Coronary artery calcifications. Assessment for potential risk factor modification, dietary therapy or pharmacologic therapy may be warranted, if clinically indicated. Electronically Signed   By: Limin  Xu M.D.   On: 03/05/2023 18:51   DG Chest 2 View Result Date: 03/05/2023 CLINICAL DATA:  Shortness of breath.  CHF. EXAM: CHEST - 2 VIEW COMPARISON:  06/17/2021 FINDINGS: Lateral view degraded by patient arm position. Pacer with leads at right atrium and right ventricle. No lead discontinuity. Apical lordotic frontal view. Mild cardiomegaly. Tortuous thoracic aorta. No pleural effusion or pneumothorax. No congestive failure. IMPRESSION: Mild cardiomegaly, without congestive failure or acute disease. Electronically Signed   By: Rockey Kilts M.D.   On: 03/05/2023 17:58   CT Head Wo Contrast Result Date: 03/05/2023 CLINICAL DATA:  Syncope/presyncope, cerebrovascular cause suspected EXAM: CT HEAD WITHOUT CONTRAST TECHNIQUE: Contiguous axial images were obtained from the base of the skull through the vertex without intravenous contrast. RADIATION DOSE REDUCTION: This exam was performed according to the departmental dose-optimization program which includes automated exposure control, adjustment of the mA and/or kV according to patient size and/or use of iterative reconstruction technique. COMPARISON:  MRI head December 21, 2004. FINDINGS: Brain: No evidence of acute infarction, hemorrhage, hydrocephalus, extra-axial collection or mass lesion/mass effect. Small remote left basal ganglia infarct. Patchy white matter hypodensities are nonspecific but compatible with chronic microvascular ischemic disease. Vascular: No hyperdense  vessel identified. Skull: No acute fracture. Sinuses/Orbits: Clear sinuses.  No acute orbital findings. Other: No mastoid effusions. IMPRESSION: No evidence of acute intracranial abnormality. Electronically Signed   By: Gilmore GORMAN Molt M.D.   On: 03/05/2023 15:00    ECHO pending  TELEMETRY reviewed by me 03/06/2023: sinus rhythm rate 60s  EKG reviewed by me: NSR LBBB rate 65 bpm, nonacute  Data reviewed by me 03/06/2023: last 24h vitals tele labs imaging I/O ED provider note, admission H&P  Principal Problem:   Vertigo Active Problems:   Hypertension   Elevated troponin   Cervico-occipital neuralgia, left   Acquired hypothyroidism   Chronic heart failure with preserved ejection fraction (HCC)   Personal history of transient cerebral ischemia   Symptomatic bradycardia s/p pacemaker   Status cardiac pacemaker    ASSESSMENT AND PLAN:  Bonnie  LELON Henson is a 89 y.o. female  with a past medical history of HTN, CKD, HFpEF, OSA, symptomatic bradycardia s/p post dual-chamber  PPM  who presented to the ED on 03/05/2023 for dizziness. Troponins checked and found to be elevated. Cardiology was consulted for further evaluation.   # Dizziness  # Vertigo Patient with hx of vertigo presenting with significant dizziness with associated N/V.  -Management per primary.  # Elevated troponin # Chronic HFpEF Patient with elevated troponin on admission. Trended 79 > 228. She is without chest pain. LHC last year with normal coronaries. Hx of HFpEF, denies any SOB or swelling issues.  -Echo pending. -Troponin most consistent with demand/supply mismatch and not ACS in the absence of chest pain.  -No plan for further cardiac diagnostics.   This patient's plan of care was discussed and created with Dr. Ammon and he is in agreement.  Signed: Danita Bloch, PA-C  03/06/2023, 8:21 AM [facility]

## 2023-03-06 NOTE — Progress Notes (Signed)
*  PRELIMINARY RESULTS* Echocardiogram 2D Echocardiogram has been performed.  Cristela Blue 03/06/2023, 8:56 AM

## 2023-03-06 NOTE — ED Notes (Addendum)
 This tech assisted pt with ambulation. Pt was able to walk around room, out into hallway, and to hallway restroom with a walker,  w/o complaint of dizziness. Pt back in bed resting comfortably. Bed in lowest locked position and call light in reach.

## 2023-03-06 NOTE — Progress Notes (Signed)
 SLP Cancellation Note  Patient Details Name: Aurea  CRISSY MCCREADIE MRN: [id number] DOB: 01/30/1930   Cancelled treatment:       Reason Eval/Treat Not Completed: SLP screened, no needs identified, will sign off (chart reviewed; consulted MD and met w/ pt in room.)  Pt denied any difficulty swallowing and is currently swallowing pills w/ NSG w/out difficulty. Pt denied any difficulty at her apartment w/ fixing meals and eating/drinking on a daily basis. MD is placing order for oral diet as she passed her swallow screen at admit w/ NSG.  Pt conversed in conversation w/out expressive/receptive deficits noted; pt denied any speech-language deficits. Speech intelligible, at her baseline. She followed instructions to sit more upright in bed, then described her day and meal situation at her apartment -- they provide 1 meal a day in the dining room, then I fix my other meals.  No further skilled ST services indicated as pt appears at her communication baseline. Pt agreed. NSG to reconsult if any change in status while admitted.      Comer Portugal, MS, CCC-SLP Speech Language Pathologist Rehab Services; [facility] [phone] (ascom) Tyese Finken 03/06/2023, 10:15 AM

## 2023-03-06 NOTE — ED Notes (Signed)
 This RN and Nash Dimmer, RN are to take over care for this pt. Pt has discharge orders now. Pt does not want to take 10am meds and states she will take them at home. Pt is alert and oriented. Will discharge pt.

## 2023-03-06 NOTE — ED Notes (Signed)
 Pt did well ambulating without dizziness--states readiness for home will inform Dr. Ashok Pall

## 2023-03-06 NOTE — ED Notes (Signed)
 Patient agreed to sit in the waiting room to wait on her nephew to come pick her up.

## 2023-03-07 LAB — HEMOGLOBIN A1C
Hgb A1c MFr Bld: 6 % — ABNORMAL HIGH (ref 4.8–5.6)
Mean Plasma Glucose: 126 mg/dL

## 2023-06-23 NOTE — Progress Notes (Signed)
 HPI  SUBJECTIVE:  Bonnie  Signora Henson is a 89 y.o. female who presents with   History of Present Illness The patient, with a history of diabetes, high cholesterol, hypertension, hypothyroidism, pacemaker, stroke, congestive heart failure, and chronic kidney disease stage IIIb, UTI, presents with sharp, low midline pressure-like abdominal pain and dysuria starting at 3 AM today. The patient also reports dysuria, described as burning and pain, and urinary frequency. The pain is worse when sitting, urination and feels like pressure.  No alleviating factors.  She has not tried anything for this.  No cloudy, odorous urine, hematuria.  There is no  fever, nausea, vomiting, back pain. No vaginal complaints.   Patient was seen here last month for decreased urine output which resolved with increasing fluid intake.  She did not have a UTI.  Additional history obtained from family member.  Past Medical History:  Diagnosis Date  . Cardiac pacemaker 07/01/2021  . Cardiac pacemaker 07/01/2021  . Colon polyp 2002   repeat colonscopy recommended 5 years later  . Dermatophytosis of nail   . DIABETES MELLITUS 10/27/2010  . Encounter for care of pacemaker 09/07/2021  . Heart palpitations 04/18/2017  . Hiatal hernia   . Hyperlipidemia 10/27/2010  . Hypertension 10/27/2010  . Hypothyroidism 10/27/2010  . Lacunar infarction (CMS/HHS-HCC) 09/04/2012  . Onycholysis of toenail   . OSA on CPAP 03/12/2011  . Osteoarthritis 10/27/2010  . Peripheral neuropathy 10/27/2010  . Personal history of transient cerebral ischemia 01/27/2014  . PONV (postoperative nausea and vomiting)    not every time  . Sleep apnea    mild   no CPAP  . TIA (transient ischemic attack)    Still has some tingling in right side of face when get tired     some tingling in hands  . Vision abnormalities     Past Surgical History:  Procedure Laterality Date  . BREAST BIOPSY     times two  . FRACTURE SURGERY     ORIF ankle  .  HYSTERECTOMY    . TONSILLECTOMY      Family History  Problem Relation Name Age of Onset  . Stroke Mother age 52   . High blood pressure (Hypertension) Mother age 65   . Brain cancer Father age 64   . Diabetes type II Sister    . Alcohol abuse Brother    . Bone cancer Brother    . High blood pressure (Hypertension) Sister    . Glaucoma Neg Hx    . Macular degeneration Neg Hx    . Blindness Neg Hx      Social History   Tobacco Use  . Smoking status: Never  . Smokeless tobacco: Never  Vaping Use  . Vaping status: Never Used  Substance Use Topics  . Alcohol use: No  . Drug use: No     Current Outpatient Medications:  .  *glucosamine sulfate/chondroitin sulfate a/ascorbic acid/mang oral, 1 tab by mouth daily, Disp: , Rfl:  .  ALPHA LIPOIC ACID ORAL, Take 1 tablet by mouth once daily, Disp: , Rfl:  .  amLODIPine  (NORVASC ) 5 MG tablet, Take 1 tablet (5 mg total) by mouth once daily, Disp: 90 tablet, Rfl: 3 .  amoxicillin (AMOXIL) 500 MG capsule, Prior to dental appt., Disp: , Rfl:  .  ascorbic acid (VITAMIN C) 1000 MG tablet, 1 tab by mouth daily, Disp: , Rfl:  .  atorvastatin  (LIPITOR) 20 MG tablet, Take 1 tablet by mouth once daily, Disp: 90 tablet, Rfl:  3 .  cyclobenzaprine (FLEXERIL) 5 MG tablet, TAKE 1 TO 2 TABLETS BY MOUTH AT BEDTIME, Disp: 60 tablet, Rfl: 11 .  escitalopram  oxalate (LEXAPRO ) 20 MG tablet, Take 1 tablet (20 mg total) by mouth once daily, Disp: 90 tablet, Rfl: 3 .  FUROsemide (LASIX) 20 MG tablet, Take 1 tablet (20 mg total) by mouth once daily as needed for Edema, Disp: 90 tablet, Rfl: 4 .  gabapentin  (NEURONTIN ) 300 MG capsule, Take 1 capsule (300 mg total) by mouth every morning AND 2 capsules (600 mg total) at bedtime., Disp: 90 capsule, Rfl: 11 .  isosorbide  mononitrate (IMDUR ) 60 MG ER tablet, Take 1 tablet by mouth once daily, Disp: 90 tablet, Rfl: 3 .  levothyroxine  (SYNTHROID ) 88 MCG tablet, Take 1 tablet (88 mcg total) by mouth every morning before  breakfast (0630) ON AN EMPTY STOMACH WITH A GLASS OF WATER AT LEAST 30 TO 60 MINUTES BEFORE BREAKFAST, Disp: 90 tablet, Rfl: 3 .  meclizine (ANTIVERT) 25 mg tablet, Take 1 tablet (25 mg total) by mouth 3 (three) times daily as needed for Dizziness, Disp: 90 tablet, Rfl: 1 .  miscellaneous medical supply Misc, Shower seat, Disp: 1 each, Rfl: 0 .  nitroGLYcerin  (NITROSTAT ) 0.4 MG SL tablet, Place 1 tablet (0.4 mg total) under the tongue every 5 (five) minutes as needed May take up to 3 doses., Disp: 90 tablet, Rfl: 3 .  ondansetron  (ZOFRAN -ODT) 4 MG disintegrating tablet, Take 1 tablet (4 mg total) by mouth every 8 (eight) hours as needed for Nausea May take TWO at a time IF NEEDED., Disp: 20 tablet, Rfl: 0 .  ramipriL  (ALTACE ) 10 MG capsule, Take 1 capsule by mouth twice daily, Disp: 180 capsule, Rfl: 3 .  traZODone  (DESYREL ) 50 MG tablet, Take 1 tablet (50 mg total) by mouth at bedtime, Disp: 30 tablet, Rfl: 11 .  walker Misc, Ultra light rollator walker with seat, Disp: 1 each, Rfl: 0 .  ZINC ACETATE TOPICAL, Apply topically, Disp: , Rfl:  .  cephalexin (KEFLEX) 500 MG capsule, Take 1 capsule (500 mg total) by mouth 2 (two) times daily for 7 days, Disp: 14 capsule, Rfl: 0  Allergies  Allergen Reactions  . Crestor  [Rosuvastatin ] Muscle Pain    Patient is currently taking, however.     ROS  As noted in HPI.   Physical Exam  BP (!) 154/79   Pulse 69   Temp 36.6 C (97.9 F) (Oral)   Ht 157.5 cm (5' 2)   Wt 83.6 kg (184 lb 6.4 oz)   LMP  (LMP Unknown)   SpO2 92%   BMI 33.73 kg/m   Constitutional: Well developed, well nourished, no acute distress Eyes:  EOMI, conjunctiva normal bilaterally HENT: Normocephalic, atraumatic,mucus membranes moist Respiratory: Normal inspiratory effort Cardiovascular: Normal rate GI: nondistended.  Soft.  No suprapubic, flank tenderness. Back: No CVAT skin: No rash, skin intact Musculoskeletal: no deformities Neurologic: Alert & oriented x 3, no  focal neuro deficits Psychiatric: Speech and behavior appropriate   Walk-In Course     Orders Placed This Encounter  Procedures  . Urine Culture, Routine - [facility]    Order Specific Question:   Release to patient    Answer:   Immediate  . Urinalysis w/Microscopic    Standing Status:   Future    Number of Occurrences:   1    Standing Expiration Date:   07/23/2023    Order Specific Question:   Release to patient    Answer:  Immediate    Results for orders placed or performed in visit on 06/23/23  Urinalysis w/Microscopic  Result Value Ref Range   Color Yellow Colorless, Straw, Light Yellow, Yellow, Dark Yellow   Clarity Clear Clear   Specific Gravity 1.005 1.000 - 1.030   pH, Urine 5.5 5.0 - 8.0   Protein, Urinalysis 30 (!) Negative, Trace mg/dL   Glucose, Urinalysis Negative Negative mg/dL   Ketones, Urinalysis Negative Negative mg/dL   Blood, Urinalysis Negative Negative   Nitrite, Urinalysis Negative Negative   Leukocyte Esterase, Urinalysis Negative Negative   White Blood Cells, Urinalysis 0-3 None Seen, 0-3 /hpf   Red Blood Cells, Urinalysis None Seen None Seen, 0-3 /hpf   Bacteria, Urinalysis Rare (!) None Seen /hpf   Squamous Epithelial Cells, Urinalysis Rare Rare, Few, None Seen /hpf     Walk-In Clinical Impression  1. Urinary tract infection without hematuria, site unspecified   2. Urine frequency   3. Dysuria   4. UTI symptoms      Walk-In Assessment/Plan  Previous records reviewed.  As noted in HPI.  Patient has proteinuria, rare bacteria.  No leukocytes or nitrites.  Will send this off for culture.  Will treat based on patient's symptoms.  Will have her increase fluids, cranberry pills, Keflex, Tylenol .  Calculated creatinine clearance on labs done in November 2024 48 mL/min.  Unfortunately, cannot prescribe Pyridium.  Discussed this with patient and family member.  Discussed labs, MDM, treatment plan, and plan for follow-up with patient and family  member.  Discussed sn/sx that should prompt return to the ED. they agree with plan.   Requested Prescriptions   Signed Prescriptions Disp Refills  . cephalexin (KEFLEX) 500 MG capsule 14 capsule 0    Sig: Take 1 capsule (500 mg total) by mouth 2 (two) times daily for 7 days      *This clinic note was created using Scientist, clinical (histocompatibility and immunogenetics). Therefore, there may be occasional mistakes despite careful proofreading.  @ESIG @  Rosina Shirts, M.D.

## 2023-10-24 ENCOUNTER — Emergency Department: Admission: EM | Admit: 2023-10-24 | Discharge: 2023-10-24 | Disposition: A | Discharge status: Home / Self Care

## 2023-10-24 ENCOUNTER — Other Ambulatory Visit: Payer: Self-pay

## 2023-10-24 ENCOUNTER — Encounter: Payer: Self-pay | Admitting: Emergency Medicine

## 2023-10-24 ENCOUNTER — Emergency Department

## 2023-10-24 DIAGNOSIS — I509 Heart failure, unspecified: Secondary | ICD-10-CM | POA: Insufficient documentation

## 2023-10-24 DIAGNOSIS — I130 Hypertensive heart and chronic kidney disease with heart failure and stage 1 through stage 4 chronic kidney disease, or unspecified chronic kidney disease: Secondary | ICD-10-CM | POA: Insufficient documentation

## 2023-10-24 DIAGNOSIS — Z8673 Personal history of transient ischemic attack (TIA), and cerebral infarction without residual deficits: Secondary | ICD-10-CM | POA: Diagnosis not present

## 2023-10-24 DIAGNOSIS — E039 Hypothyroidism, unspecified: Secondary | ICD-10-CM | POA: Diagnosis not present

## 2023-10-24 DIAGNOSIS — E1122 Type 2 diabetes mellitus with diabetic chronic kidney disease: Secondary | ICD-10-CM | POA: Diagnosis not present

## 2023-10-24 DIAGNOSIS — N1832 Chronic kidney disease, stage 3b: Secondary | ICD-10-CM | POA: Insufficient documentation

## 2023-10-24 DIAGNOSIS — R519 Headache, unspecified: Secondary | ICD-10-CM | POA: Insufficient documentation

## 2023-10-24 DIAGNOSIS — I10 Essential (primary) hypertension: Secondary | ICD-10-CM

## 2023-10-24 LAB — CBC WITH DIFFERENTIAL/PLATELET
Abs Immature Granulocytes: 0.03 K/uL (ref 0.00–0.07)
Basophils Absolute: 0 K/uL (ref 0.0–0.1)
Basophils Relative: 1 %
Eosinophils Absolute: 0.2 K/uL (ref 0.0–0.5)
Eosinophils Relative: 2 %
HCT: 47.3 % — ABNORMAL HIGH (ref 36.0–46.0)
Hemoglobin: 15.1 g/dL — ABNORMAL HIGH (ref 12.0–15.0)
Immature Granulocytes: 0 %
Lymphocytes Relative: 17 %
Lymphs Abs: 1.4 K/uL (ref 0.7–4.0)
MCH: 29.5 pg (ref 26.0–34.0)
MCHC: 31.9 g/dL (ref 30.0–36.0)
MCV: 92.6 fL (ref 80.0–100.0)
Monocytes Absolute: 0.6 K/uL (ref 0.1–1.0)
Monocytes Relative: 7 %
Neutro Abs: 5.9 K/uL (ref 1.7–7.7)
Neutrophils Relative %: 73 %
Platelets: 232 K/uL (ref 150–400)
RBC: 5.11 MIL/uL (ref 3.87–5.11)
RDW: 13.6 % (ref 11.5–15.5)
WBC: 8.1 K/uL (ref 4.0–10.5)
nRBC: 0 % (ref 0.0–0.2)

## 2023-10-24 LAB — URINALYSIS, ROUTINE W REFLEX MICROSCOPIC
Bilirubin Urine: NEGATIVE
Glucose, UA: NEGATIVE mg/dL
Hgb urine dipstick: NEGATIVE
Ketones, ur: NEGATIVE mg/dL
Leukocytes,Ua: NEGATIVE
Nitrite: NEGATIVE
Protein, ur: 100 mg/dL — AB
Specific Gravity, Urine: 1.003 — ABNORMAL LOW (ref 1.005–1.030)
Squamous Epithelial / HPF: 0 /HPF (ref 0–5)
pH: 5 (ref 5.0–8.0)

## 2023-10-24 LAB — COMPREHENSIVE METABOLIC PANEL WITH GFR
ALT: 21 U/L (ref 0–44)
AST: 30 U/L (ref 15–41)
Albumin: 4.7 g/dL (ref 3.5–5.0)
Alkaline Phosphatase: 90 U/L (ref 38–126)
Anion gap: 11 (ref 5–15)
BUN: 33 mg/dL — ABNORMAL HIGH (ref 8–23)
CO2: 26 mmol/L (ref 22–32)
Calcium: 9.5 mg/dL (ref 8.9–10.3)
Chloride: 94 mmol/L — ABNORMAL LOW (ref 98–111)
Creatinine, Ser: 0.77 mg/dL (ref 0.44–1.00)
GFR, Estimated: >60 mL/min (ref >60)
Glucose, Bld: 143 mg/dL — ABNORMAL HIGH (ref 70–99)
Potassium: 4.1 mmol/L (ref 3.5–5.1)
Sodium: 131 mmol/L — ABNORMAL LOW (ref 135–145)
Total Bilirubin: 0.8 mg/dL (ref 0.0–1.2)
Total Protein: 8.1 g/dL (ref 6.5–8.1)

## 2023-10-24 MED ORDER — HYDRALAZINE HCL 25 MG PO TABS: 25.0000 mg | ORAL_TABLET | Freq: Three times a day (TID) | ORAL | 0 refills | Status: AC

## 2023-10-24 MED ORDER — HYDRALAZINE HCL 20 MG/ML IJ SOLN
10.0000 mg | Freq: Once | INTRAMUSCULAR | Status: AC
  Administered 2023-10-24: 10 mg via INTRAVENOUS
  Filled 2023-10-24: qty 1

## 2023-10-24 NOTE — ED Triage Notes (Addendum)
 Pt arrives via EMS from independent living c/o HA and HTN. Pt had HA this afternoon and checked BP that was >200. Pt took amlodipine  1 hr pta. Pt reports left sided HA 8/10. Pt A&Ox4

## 2023-10-24 NOTE — Discharge Instructions (Signed)
 You was seen in the emergency department with elevated blood pressure.  This down trended with hydralazine .  You were previously on hydralazine  but this prescription has ended.  I am represcribing this.  Please call your primary care physician and discuss further medication changes.  Return with any acutely worsening symptoms or any other emergency. -- RETURN PRECAUTIONS & AFTERCARE: (ENGLISH) RETURN PRECAUTIONS: Return immediately to the emergency department or see/call your doctor if you feel worse, weak or have changes in speech or vision, are short of breath, have fever, vomiting, pain, bleeding or dark stool, trouble urinating or any new issues. Return here or see/call your doctor if not improving as expected for your suspected condition. FOLLOW-UP CARE: Call your doctor and/or any doctors we referred you to for more advice and to make an appointment. Do this today, tomorrow or after the weekend. Some doctors only take PPO insurance so if you have HMO insurance you may want to contact your HMO or your regular doctor for referral to a specialist within your plan. Either way tell the doctor's office that it was a referral from the emergency department so you get the soonest possible appointment.  YOUR TEST RESULTS: Take result reports of any blood or urine tests, imaging tests and EKG's to your doctor and any referral doctor. Have any abnormal tests repeated. Your doctor or a referral doctor can let you know when this should be done. Also make sure your doctor contacts this hospital to get any test results that are not currently available such as cultures or special tests for infection and final imaging reports, which are often not available at the time you leave the ER but which may list additional important findings that are not documented on the preliminary report. BLOOD PRESSURE: If your blood pressure was greater than 120/80 have your blood pressure rechecked within 1 to 2 weeks. MEDICATION SIDE  EFFECTS: Do not drive, walk, bike, take the bus, etc. if you have received or are being prescribed any sedating medications such as those for pain or anxiety or certain antihistamines like Benadryl. If you have been give one of these here get a taxi home or have a friend drive you home. Ask your pharmacist to counsel you on potential side effects of any new medication

## 2023-10-24 NOTE — ED Notes (Signed)
 Pt ro radiology at this time

## 2023-10-24 NOTE — ED Provider Notes (Signed)
 [facility] Provider Note    Event Date/Time   First MD Initiated Contact with Patient 10/24/23 1914     (approximate)   History   Hypertension and Headache   HPI  Bonnie  LELON Henson is a 89 y.o. female  history of diabetes, high cholesterol, hypertension, hypothyroidism, pacemaker, stroke, congestive heart failure, and chronic kidney disease stage IIIb, UTI presents from home with concerns for high blood pressure and headache.  Patient lives at home with home health care and developed a headache this afternoon.  Her home health aide took her blood pressure and it will stay with systolic over 200s.  She took her amlodipine  1 hour prior to presentation reports that her headache is slowly improving.  This is not the worst headache she has ever had and it was gradual in onset.  She denies any hearing or vision changes, neck pain chest pain shortness of breath or abdominal pain.  She denies any fevers or chills or recent illness      Physical Exam   Triage Vital Signs: ED Triage Vitals  Encounter Vitals Group     BP      Girls Systolic BP Percentile      Girls Diastolic BP Percentile      Boys Systolic BP Percentile      Boys Diastolic BP Percentile      Pulse      Resp      Temp      Temp src      SpO2      Weight      Height      Head Circumference      Peak Flow      Pain Score      Pain Loc      Pain Education      Exclude from Growth Chart     Most recent vital signs: Vitals:   10/24/23 2019 10/24/23 2128  BP: (!) 161/72 (!) 164/70  Pulse: 62 64  Resp: 17 16  Temp:  97.9 F (36.6 C)  SpO2: 98% 99%    Nursing Triage Note reviewed. Vital signs reviewed and patients oxygen saturation is normoxic  General: Patient is well nourished, well developed, awake and alert, resting comfortably in no acute distress Head: Normocephalic and atraumatic Eyes: Normal inspection, extraocular muscles intact, no conjunctival pallor Ear, nose, throat:  Normal external exam Neck: Normal range of motion Respiratory: Patient is in no respiratory distress, lungs CTAB Cardiovascular: Patient is not tachycardic, RRR without murmur appreciated GI: Abd SNT with no guarding or rebound  Back: Normal inspection of the back with good strength and range of motion throughout all ext Extremities: pulses intact with good cap refills, no LE pitting edema or calf tenderness Neuro: The patient is alert and oriented to person, place, and time, appropriately conversive, with 5/5 bilat UE/LE strength, no gross motor or sensory defects noted. Coordination appears to be adequate.  Ambulates without ataxia to the bathroom Skin: Warm, dry, and intact Psych: normal mood and affect, no SI or HI  ED Results / Procedures / Treatments   Labs (all labs ordered are listed, but only abnormal results are displayed) Labs Reviewed  CBC WITH DIFFERENTIAL/PLATELET - Abnormal; Notable for the following components:      Result Value   Hemoglobin 15.1 (*)    HCT 47.3 (*)    All other components within normal limits  COMPREHENSIVE METABOLIC PANEL WITH GFR - Abnormal; Notable for the following components:  Sodium 131 (*)    Chloride 94 (*)    Glucose, Bld 143 (*)    BUN 33 (*)    All other components within normal limits  URINALYSIS, ROUTINE W REFLEX MICROSCOPIC - Abnormal; Notable for the following components:   Color, Urine COLORLESS (*)    APPearance CLEAR (*)    Specific Gravity, Urine 1.003 (*)    Protein, ur 100 (*)    Bacteria, UA RARE (*)    All other components within normal limits     EKG EKG and rhythm strip are interpreted by myself:   EKG: [Normal sinus rhythm] at heart rate of 64, normal QRS duration, QTc 472, nonspecific ST segments and T waves no ectopy EKG not consistent with Acute STEMI Rhythm strip: NSR in lead II LBB stable from 03/05/2023  RADIOLOGY CT head: No intracranial hemorrhage on my independent review and interpretation and  radiologist agrees    PROCEDURES:  Critical Care performed: No  Procedures   MEDICATIONS ORDERED IN ED: Medications  hydrALAZINE  (APRESOLINE ) injection 10 mg (10 mg Intravenous Given 10/24/23 2003)     IMPRESSION / MDM / ASSESSMENT AND PLAN / ED COURSE                                Differential diagnosis includes, but is not limited to, intracranial hemorrhage, essential hypertension, arrhythmia, acute renal insufficiency, electrolyte derangement  ED course: Patient presents without any focal neurological deficits.  She has no neck tenderness or pain and this is not the worst headache of her life.  CT head demonstrated no intracranial hemorrhage.  She was given a dose of hydralazine  and blood pressure did downtrend and headache resolved.  She had no profound electrolyte derangements or acute renal insufficiency and urinalysis was not consistent with UTI.  She felt comfortable returning home and will initiate as needed hydralazine  which she was previously prescribed but is no longer taking.  She will follow-up with for primary care physician as soon as possible   Clinical Course as of 10/24/23 2355  Wed Oct 24, 2023  2003 CT Head Wo Contrast No acute abnormality [HD]  2003 BP(!): 203/80 Given patient is bradycardic we will treat with a dose of hydralazine  [HD]  2049 Creatinine: 0.77 No acute renal insufficiency [HD]  2049 Comprehensive metabolic panel(!) No profound electrolytes [HD]  2049 BP(!): 161/72 Much improved [HD]  2049 Urinalysis, Routine w reflex microscopic -Urine, Clean Catch(!) Not consistent with urinary tract infection [HD]    Clinical Course User Index [HD] Nicholaus Rolland BRAVO, MD   At time of discharge there is no evidence of acute life, limb, vision, or fertility threat. Patient has stable vital signs, pain is well controlled, patient is ambulatory and p.o. tolerant.  Discharge instructions were completed using the [employer] system. I would refer you to those  at this time. All warnings prescriptions follow-up etc. were discussed in detail with the patient. Patient indicates understanding and is agreeable with this plan. All questions answered.  Patient is made aware that they may return to the emergency department for any worsening or new condition or for any other emergency. -\ Risk: 5 This patient has a high risk of morbidity due to further diagnostic testing or treatment. Rationale: This patient's evaluation and management involve a high risk of morbidity due to the potential severity of presenting symptoms, need for diagnostic testing, and/or initiation of treatment that may require close monitoring. The  differential includes conditions with potential for significant deterioration or requiring escalation of care. Treatment decisions in the ED, including medication administration, procedural interventions, or disposition planning, reflect this level of risk. Additional Support: -- Drug therapy requiring intensive monitoring for toxicity [ ]  -- Decision regarding elective major surgery with idenitified patient or procedure risk factors [ ]  -- Decision regarding hospitalization or escalation of hospital-level care [ ]  -- Decision not to resuscitate or to de-escalate care because of poor prognosis [ ]  -- Parental controlled substances [ ]   COPA: 5 The patient has a severe exacerbation, progression, or side effect of treatment of the following illness/illnesses: []  OR  The patient has the following acute or chronic illness/injury that poses a possible threat to life or bodily function: [X] : The patient has a potentially serious acute condition or an acute exacerbation of a chronic illness requiring urgent evaluation and management in the Emergency Department. The clinical presentation necessitates immediate consideration of life-threatening or function-threatening diagnoses, even if they are ultimately ruled out.  Data(2/3 categories following were  performed): 5 I reviewed or ordered at least three unique tests, external notes, and/or the history required an independent historian as one of the three requirements as following: CBC, CMP, urinalysis AND  I independently interpreted the following test: CT head without contrast OR  I discussed the management of the patient with the following external physician or qualified healthcare provider: []    Suggested E/M Coding Level: 5, 99285, This has been selected based on the 2021-11-25 CPT guidelines for E/M codes in the Emergency Department based on 2/3 of the CoPA, Data, and Risk.   FINAL CLINICAL IMPRESSION(S) / ED DIAGNOSES   Final diagnoses:  Primary hypertension  Nonintractable headache, unspecified chronicity pattern, unspecified headache type     Rx / DC Orders   ED Discharge Orders          Ordered    hydrALAZINE  (APRESOLINE ) 25 MG tablet  3 times daily        10/24/23 Nov 25, 2057             Note:  This document was prepared using Dragon voice recognition software and may include unintentional dictation errors.   Nicholaus Rolland BRAVO, MD 10/24/23 [phone]

## 2023-10-26 ENCOUNTER — Other Ambulatory Visit: Payer: Self-pay
